# Patient Record
Sex: Female | Born: 2000
Health system: Southern US, Community
[De-identification: ages and names within clinical notes are randomized; demographics above are authoritative.]

## PROBLEM LIST (undated history)

## (undated) HISTORY — PX: HERNIA REPAIR: SHX51

---
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
## 2022-01-02 NOTE — Progress Notes (Unsigned)
[facility] [phone]   [phone]  CONSULT NOTE  REFERRING PHYSICIAN: Dr. 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  And***is referred to the clinic for evaluation of anemia and elevated factor VIII  The patient has recently been evaluated by her OB/GYN for the chief complaint of abnormal uterine bleeding.  While evaluating her abnormal uterine bleeding, she had a CBC and Feng Bilik brand testing performed.  Her CBC showed microcytic anemia with a hemoglobin of 7.7 and a low MCV at 62.5, normal white blood cell count, normal platelet count.  The patient had a normal von Willebrand factor which was 114, normal von Willebrand activity at 77 but her factor VIII was elevated at 165.  Unclear if she is increased risk for thromboembolic events and part of her management for her abnormal uterine bleeding may include estrogen-containing birth control pills or lysteda.  She was referred to the clinic for management of her iron deficiency and consideration of IV iron as well as whether the factor VIII has any clinical significance  I do not have any prior records. To her knowledge, she started having anemia since ***l.  She denies ever needing a blood transfusion or an iron infusion before.    She denies heavy menstrual bleeding and reports her menstrual cycles last approximately *** days, heavy ***.  On the second day of her cycle, she estimates that she will need *** tampons.     Regarding symptoms, she reports fatigue***.   She denies any lightheadedness or dyspnea on exertion.  She reports she had palpitations ***s.  She denies epistaxis, gingival bleeding, hemoptysis, hematemesis, or melena.  Denies any changes in bowel habits.  Denies any fever, chills, or night sweats.   She denies any lymphadenopathy. Denies any NSAID use except she may take *** tablets during her menstrual cycle.  Denies any chronic kidney disease. She denies any particular dietary habits such as being a vegan or vegetarian, but she does not eat a lot of red meat***8.  She estimates she eats red meat approximately ***week.  Denies any history of any bariatric surgery.  She reports craving ice chips***.  Blood clots? Fhx clots.???    HPI  No past medical history on file.  Past Surgical History:  Procedure Laterality Date   HERNIA REPAIR      Family History  Problem Relation Age of Onset   Healthy Mother    Healthy Father     Social History Social History   Tobacco Use   Smoking status: Never   Smokeless tobacco: Never  Vaping Use   Vaping Use: Never used  Substance Use Topics   Alcohol use: Never   Drug use: Never    No Known Allergies  Current Outpatient Medications  Medication Sig Dispense Refill   FLUoxetine (PROZAC) 10 MG capsule TAKE 1 CAPSULE BY MOUTH ONCE DAILY. 30 capsule 1   FLUoxetine (PROZAC) 10 MG capsule Take 1 capsule by mouth once daily. 30 capsule 1   FLUoxetine (PROZAC) 10 MG capsule Take 1 capsule (10 mg total) by mouth daily. 30 capsule 2   FLUoxetine (PROZAC) 20 MG capsule TAKE 1 CAPSULE BY MOUTH DAILY 30 capsule 1   FLUoxetine (PROZAC) 20 MG capsule TAKE 1 CAPSULE BY MOUTH DAILY MAY FILL ON 10/07/20 30  capsule 2   FLUoxetine (PROZAC) 20 MG capsule Take 1 capsule by mouth daily 30 capsule 2   ibuprofen (ADVIL,MOTRIN) 100 MG/5ML suspension Take 20 mLs (400 mg total) by mouth every 6 (six) hours as needed for mild pain. 237 mL 0   norgestimate-ethinyl estradiol (SPRINTEC 28) 0.25-35 MG-MCG tablet Take 1 tablet by mouth daily. 28 tablet 12   ondansetron (ZOFRAN ODT) 4 MG disintegrating tablet Dissolve 1 tablet (4 mg total) by mouth every 8 (eight) hours as needed for nausea or vomiting. 20 tablet 0   oseltamivir (TAMIFLU) 75 MG capsule Take 1 capsule  (75 mg total) by mouth 2 (two) times daily for 5 days 10 capsule 0   pimecrolimus (ELIDEL) 1 % cream Apply to affected areas 2 times daily 60 g 12   propranolol (INDERAL) 20 MG tablet TAKE 1/2 TO 1 TABLET DAILY BY MOUTH FOR SOCIAL ANXIETY TO BE FILLED 10/07/20 30 tablet 2   propranolol (INDERAL) 20 MG tablet TAKE 1/2 TO 1 TABLET BY MOUTH DAILY FOR SOCIAL ANXIETY 30 tablet 1   propranolol (INDERAL) 20 MG tablet TAKE 1/2 TO 1 TABLET BY MOUTH DAILY FOR SOCIAL ANXIETY. 30 tablet 1   propranolol (INDERAL) 20 MG tablet Take 0.5-1 tablet (10-20 mg total) by mouth daily for social anxiety 30 tablet 2   No current facility-administered medications for this visit.    REVIEW OF SYSTEMS:   Review of Systems  Constitutional: Negative for appetite change, chills, fatigue, fever and unexpected weight change.  HENT:   Negative for mouth sores, nosebleeds, sore throat and trouble swallowing.   Eyes: Negative for eye problems and icterus.  Respiratory: Negative for cough, hemoptysis, shortness of breath and wheezing.   Cardiovascular: Negative for chest pain and leg swelling.  Gastrointestinal: Negative for abdominal pain, constipation, diarrhea, nausea and vomiting.  Genitourinary: Negative for bladder incontinence, difficulty urinating, dysuria, frequency and hematuria.   Musculoskeletal: Negative for back pain, gait problem, neck pain and neck stiffness.  Skin: Negative for itching and rash.  Neurological: Negative for dizziness, extremity weakness, gait problem, headaches, light-headedness and seizures.  Hematological: Negative for adenopathy. Does not bruise/bleed easily.  Psychiatric/Behavioral: Negative for confusion, depression and sleep disturbance. The patient is not nervous/anxious.     PHYSICAL EXAMINATION:  There were no vitals taken for this visit.  ECOG PERFORMANCE STATUS: {CHL ONC ECOG [id number]  Physical Exam  Constitutional: Oriented to person, place, and time and well-developed,  well-nourished, and in no distress. No distress.  HENT:  Head: Normocephalic and atraumatic.  Mouth/Throat: Oropharynx is clear and moist. No oropharyngeal exudate.  Eyes: Conjunctivae are normal. Right eye exhibits no discharge. Left eye exhibits no discharge. No scleral icterus.  Neck: Normal range of motion. Neck supple.  Cardiovascular: Normal rate, regular rhythm, normal heart sounds and intact distal pulses.   Pulmonary/Chest: Effort normal and breath sounds normal. No respiratory distress. No wheezes. No rales.  Abdominal: Soft. Bowel sounds are normal. Exhibits no distension and no mass. There is no tenderness.  Musculoskeletal: Normal range of motion. Exhibits no edema.  Lymphadenopathy:    No cervical adenopathy.  Neurological: Alert and oriented to person, place, and time. Exhibits normal muscle tone. Gait normal. Coordination normal.  Skin: Skin is warm and dry. No rash noted. Not diaphoretic. No erythema. No pallor.  Psychiatric: Mood, memory and judgment normal.  Vitals reviewed.  LABORATORY DATA: No results found for: "WBC", "HGB", "HCT", "MCV", "PLT"    Chemistry   No results found for: "NA", "K", "  CL", "CO2", "BUN", "CREATININE", "GLU" No results found for: "CALCIUM", "ALKPHOS", "AST", "ALT", "BILITOT"     RADIOGRAPHIC STUDIES: No results found.  ASSESSMENT: This is a very pleasant 21 year old African-American female referred to the clinic for iron deficiency anemia and elevated factorVIII activity.  The patient had several lab studies performed today including CBC, CMP, iron studies, ferritin, folate, vitamin B12, and hemoglobinopathy panel.  She also had repeat testing for ***VWB? Hypercoag?  The patient was seen with Dr. Arbutus Ped today.  Dr. Arbutus Ped reviewed results recommendations with the patient.  We will arrange for IV iron infusions with Venofer 300 mg weekly x3.  The patient was encouraged to continue taking the iron supplement.  She was also given a  handout of iron rich food strongly encouraged to increase her dietary intake of iron rich food.  We will see her back for follow-up visit in 3 months for evaluation and repeat blood work for repeat CBC, iron studies, and ferritin.  GYN?    The patient voices understanding of current disease status and treatment options and is in agreement with the current care plan.  All questions were answered. The patient knows to call the clinic with any problems, questions or concerns. We can certainly see the patient much sooner if necessary.  Thank you so much for allowing me to participate in the care of Kendra Cline. I will continue to follow up the patient with you and assist in her care.  I spent {CHL ONC TIME VISIT - HYIFO:[id number]} counseling the patient face to face. The total time spent in the appointment was {CHL ONC TIME VISIT - [facility]:[id number]}.  Disclaimer: This note was dictated with voice recognition software. Similar sounding words can inadvertently be transcribed and may not be corrected upon review.   Chinwe Lope L Tamaka Sawin January 05, 2022, 7:05 PM

## 2022-01-06 ENCOUNTER — Other Ambulatory Visit: Payer: Self-pay | Admitting: Physician Assistant

## 2022-01-06 ENCOUNTER — Other Ambulatory Visit: Payer: Self-pay

## 2022-01-06 ENCOUNTER — Inpatient Hospital Stay: Payer: No Typology Code available for payment source | Attending: Internal Medicine | Admitting: Physician Assistant

## 2022-01-06 ENCOUNTER — Encounter: Payer: Self-pay | Admitting: Physician Assistant

## 2022-01-06 ENCOUNTER — Inpatient Hospital Stay: Payer: No Typology Code available for payment source

## 2022-01-06 VITALS — BP 108/68 | HR 68 | Temp 98.6°F | Resp 15 | Ht 67.0 in | Wt 146.7 lb

## 2022-01-06 DIAGNOSIS — D573 Sickle-cell trait: Secondary | ICD-10-CM | POA: Insufficient documentation

## 2022-01-06 DIAGNOSIS — N921 Excessive and frequent menstruation with irregular cycle: Secondary | ICD-10-CM | POA: Diagnosis not present

## 2022-01-06 DIAGNOSIS — D509 Iron deficiency anemia, unspecified: Secondary | ICD-10-CM

## 2022-01-06 DIAGNOSIS — D688 Other specified coagulation defects: Secondary | ICD-10-CM | POA: Diagnosis not present

## 2022-01-06 DIAGNOSIS — R899 Unspecified abnormal finding in specimens from other organs, systems and tissues: Secondary | ICD-10-CM

## 2022-01-06 DIAGNOSIS — D66 Hereditary factor VIII deficiency: Secondary | ICD-10-CM | POA: Diagnosis not present

## 2022-01-06 DIAGNOSIS — D539 Nutritional anemia, unspecified: Secondary | ICD-10-CM | POA: Insufficient documentation

## 2022-01-06 DIAGNOSIS — F32A Depression, unspecified: Secondary | ICD-10-CM

## 2022-01-06 DIAGNOSIS — F419 Anxiety disorder, unspecified: Secondary | ICD-10-CM

## 2022-01-06 LAB — CBC WITH DIFFERENTIAL (CANCER CENTER ONLY)
Abs Immature Granulocytes: 0.01 10*3/uL (ref 0.00–0.07)
Basophils Absolute: 0.1 10*3/uL (ref 0.0–0.1)
Basophils Relative: 1 %
Eosinophils Absolute: 0.1 10*3/uL (ref 0.0–0.5)
Eosinophils Relative: 2 %
HCT: 26.3 % — ABNORMAL LOW (ref 36.0–46.0)
Hemoglobin: 7.9 g/dL — ABNORMAL LOW (ref 12.0–15.0)
Immature Granulocytes: 0 %
Lymphocytes Relative: 41 %
Lymphs Abs: 1.7 10*3/uL (ref 0.7–4.0)
MCH: 20.6 pg — ABNORMAL LOW (ref 26.0–34.0)
MCHC: 30 g/dL (ref 30.0–36.0)
MCV: 68.5 fL — ABNORMAL LOW (ref 80.0–100.0)
Monocytes Absolute: 0.2 10*3/uL (ref 0.1–1.0)
Monocytes Relative: 5 %
Neutro Abs: 2.1 10*3/uL (ref 1.7–7.7)
Neutrophils Relative %: 51 %
Platelet Count: 344 10*3/uL (ref 150–400)
RBC: 3.84 MIL/uL — ABNORMAL LOW (ref 3.87–5.11)
RDW: 18.7 % — ABNORMAL HIGH (ref 11.5–15.5)
WBC Count: 4.2 10*3/uL (ref 4.0–10.5)
nRBC: 0 % (ref 0.0–0.2)

## 2022-01-06 LAB — CMP (CANCER CENTER ONLY)
ALT: 13 U/L (ref 0–44)
AST: 17 U/L (ref 15–41)
Albumin: 4.2 g/dL (ref 3.5–5.0)
Alkaline Phosphatase: 53 U/L (ref 38–126)
Anion gap: 6 (ref 5–15)
BUN: 10 mg/dL (ref 6–20)
CO2: 24 mmol/L (ref 22–32)
Calcium: 9.6 mg/dL (ref 8.9–10.3)
Chloride: 106 mmol/L (ref 98–111)
Creatinine: 0.84 mg/dL (ref 0.44–1.00)
GFR, Estimated: >60 mL/min (ref >60)
Glucose, Bld: 92 mg/dL (ref 70–99)
Potassium: 4 mmol/L (ref 3.5–5.1)
Sodium: 136 mmol/L (ref 135–145)
Total Bilirubin: 0.3 mg/dL (ref 0.3–1.2)
Total Protein: 7.2 g/dL (ref 6.5–8.1)

## 2022-01-06 LAB — IRON AND IRON BINDING CAPACITY (CC-WL,HP ONLY)
Iron: 18 ug/dL — ABNORMAL LOW (ref 28–170)
Saturation Ratios: 4 % — ABNORMAL LOW (ref 10.4–31.8)
TIBC: 486 ug/dL — ABNORMAL HIGH (ref 250–450)
UIBC: 468 ug/dL — ABNORMAL HIGH (ref 148–442)

## 2022-01-06 LAB — VITAMIN B12: Vitamin B-12: 487 pg/mL (ref 180–914)

## 2022-01-06 LAB — FERRITIN: Ferritin: 4 ng/mL — ABNORMAL LOW (ref 11–307)

## 2022-01-06 LAB — SAMPLE TO BLOOD BANK

## 2022-01-06 LAB — FOLATE: Folate: 19.5 ng/mL (ref >5.9)

## 2022-01-07 LAB — FACTOR 8 ASSAY: Coagulation Factor VIII: 157 % — ABNORMAL HIGH (ref 56–140)

## 2022-01-08 ENCOUNTER — Telehealth: Payer: Self-pay | Admitting: Pharmacy Technician

## 2022-01-08 NOTE — Telephone Encounter (Signed)
Auth Submission: no auth needed Payer: [employer] Medication & CPT/J Code(s) submitted: Venofer (Iron Sucrose) J1756 Route of submission (phone, fax, portal): portal Auth type: Buy/Bill Units/visits requested: x3 doses Reference number:  Approval from: 01/08/22 to 04/10/22

## 2022-01-09 ENCOUNTER — Other Ambulatory Visit: Payer: Self-pay | Admitting: Physician Assistant

## 2022-01-09 DIAGNOSIS — R899 Unspecified abnormal finding in specimens from other organs, systems and tissues: Secondary | ICD-10-CM

## 2022-01-09 NOTE — Progress Notes (Signed)
Called and spoke with the OBGYN office of Dr. Steva Ready about the patient's clotting factor result. I informed them that we would recheck the lab here at the cancer center in about 2-3 months. I also informed them that Cassandra recommends the patient avoiding any birth control with estrogen or lysteda. No further action needed at this time.

## 2022-01-16 ENCOUNTER — Ambulatory Visit (INDEPENDENT_AMBULATORY_CARE_PROVIDER_SITE_OTHER): Payer: No Typology Code available for payment source

## 2022-01-16 VITALS — BP 102/66 | HR 75 | Temp 98.8°F | Resp 16 | Ht 67.0 in | Wt 150.6 lb

## 2022-01-16 DIAGNOSIS — D539 Nutritional anemia, unspecified: Secondary | ICD-10-CM | POA: Diagnosis not present

## 2022-01-16 MED ORDER — ACETAMINOPHEN 325 MG PO TABS
650.0000 mg | ORAL_TABLET | Freq: Once | ORAL | Status: AC
  Administered 2022-01-16: 650 mg via ORAL
  Filled 2022-01-16: qty 2

## 2022-01-16 MED ORDER — DIPHENHYDRAMINE HCL 25 MG PO CAPS
25.0000 mg | ORAL_CAPSULE | Freq: Once | ORAL | Status: AC
  Administered 2022-01-16: 25 mg via ORAL
  Filled 2022-01-16: qty 1

## 2022-01-16 MED ORDER — SODIUM CHLORIDE 0.9 % IV SOLN
300.0000 mg | Freq: Once | INTRAVENOUS | Status: AC
  Administered 2022-01-16: 300 mg via INTRAVENOUS
  Filled 2022-01-16: qty 15

## 2022-01-16 NOTE — Progress Notes (Signed)
Diagnosis: Iron Deficiency Anemia  Provider:  Chilton Greathouse, MD  Procedure: Infusion  IV Type: Peripheral, IV Location: L Antecubital  Venofer (Iron Sucrose), Dose: 300 mg  Infusion Start Time: 1401  Infusion Stop Time: 1548  Post Infusion IV Care: Observation period completed and Peripheral IV Discontinued  Discharge: Condition: Good, Destination: Home . AVS provided to patient.   Performed by:  Loney Hering, LPN

## 2022-01-17 ENCOUNTER — Telehealth: Payer: Self-pay | Admitting: Internal Medicine

## 2022-01-17 NOTE — Telephone Encounter (Signed)
Called patient regarding upcoming August appointments, patient is notified. 

## 2022-01-18 DIAGNOSIS — Z419 Encounter for procedure for purposes other than remedying health state, unspecified: Secondary | ICD-10-CM | POA: Diagnosis not present

## 2022-01-22 ENCOUNTER — Ambulatory Visit (INDEPENDENT_AMBULATORY_CARE_PROVIDER_SITE_OTHER): Payer: No Typology Code available for payment source

## 2022-01-22 VITALS — BP 98/63 | HR 89 | Temp 99.0°F | Resp 18 | Ht 68.0 in | Wt 150.8 lb

## 2022-01-22 DIAGNOSIS — D539 Nutritional anemia, unspecified: Secondary | ICD-10-CM | POA: Diagnosis not present

## 2022-01-22 MED ORDER — ACETAMINOPHEN 325 MG PO TABS: 650.0000 mg | ORAL_TABLET | Freq: Once | ORAL | Status: DC

## 2022-01-22 MED ORDER — SODIUM CHLORIDE 0.9 % IV SOLN
300.0000 mg | Freq: Once | INTRAVENOUS | Status: AC
  Administered 2022-01-22: 300 mg via INTRAVENOUS
  Filled 2022-01-22: qty 15

## 2022-01-22 MED ORDER — DIPHENHYDRAMINE HCL 25 MG PO CAPS: 25.0000 mg | ORAL_CAPSULE | Freq: Once | ORAL | Status: DC

## 2022-01-22 NOTE — Progress Notes (Signed)
Diagnosis: Iron Deficiency Anemia  Provider:  Chilton Greathouse, MD  Procedure: Infusion  IV Type: Peripheral, IV Location: R Forearm  Venofer (Iron Sucrose), Dose: 300 mg  Infusion Start Time: 1350  Infusion Stop Time: 1523  Post Infusion IV Care: Peripheral IV Discontinued  Discharge: Condition: Good, Destination: Home . AVS provided to patient.   Performed by:  Loney Hering, LPN

## 2022-01-31 ENCOUNTER — Ambulatory Visit (INDEPENDENT_AMBULATORY_CARE_PROVIDER_SITE_OTHER): Payer: No Typology Code available for payment source

## 2022-01-31 VITALS — BP 113/73 | HR 75 | Temp 97.9°F | Resp 16 | Ht 67.0 in | Wt 148.0 lb

## 2022-01-31 DIAGNOSIS — D539 Nutritional anemia, unspecified: Secondary | ICD-10-CM

## 2022-01-31 MED ORDER — SODIUM CHLORIDE 0.9 % IV SOLN
300.0000 mg | Freq: Once | INTRAVENOUS | Status: AC
  Administered 2022-01-31: 300 mg via INTRAVENOUS
  Filled 2022-01-31: qty 15

## 2022-01-31 MED ORDER — DIPHENHYDRAMINE HCL 25 MG PO CAPS
25.0000 mg | ORAL_CAPSULE | Freq: Once | ORAL | Status: DC
  Filled 2022-01-31: qty 1

## 2022-01-31 MED ORDER — ACETAMINOPHEN 325 MG PO TABS
650.0000 mg | ORAL_TABLET | Freq: Once | ORAL | Status: DC
  Filled 2022-01-31: qty 2

## 2022-01-31 NOTE — Progress Notes (Signed)
Diagnosis: Iron Deficiency Anemia  Provider:  Chilton Greathouse, MD  Procedure: Infusion  IV Type: Peripheral, IV Location: L Antecubital  Venofer (Iron Sucrose), Dose: 300 mg  Infusion Start Time: 1353  Infusion Stop Time: 1528  Post Infusion IV Care: Peripheral IV Discontinued  Discharge: Condition: Good, Destination: Home . AVS provided to patient.   Performed by:  Loney Hering, LPN

## 2022-02-07 ENCOUNTER — Other Ambulatory Visit (HOSPITAL_COMMUNITY): Payer: Self-pay

## 2022-02-10 ENCOUNTER — Telehealth: Payer: Self-pay | Admitting: Internal Medicine

## 2022-02-10 NOTE — Telephone Encounter (Signed)
Called patient regarding upcoming August appointments, spoke with patient's mother. Patient will be notified.

## 2022-02-11 ENCOUNTER — Other Ambulatory Visit (HOSPITAL_COMMUNITY): Payer: Self-pay

## 2022-02-11 MED ORDER — ADAPALENE 0.1 % EX CREA
TOPICAL_CREAM | CUTANEOUS | 2 refills | Status: DC
  Filled 2022-02-13: qty 45, 30d supply, fill #0

## 2022-02-12 ENCOUNTER — Other Ambulatory Visit (HOSPITAL_COMMUNITY): Payer: Self-pay

## 2022-02-13 ENCOUNTER — Other Ambulatory Visit (HOSPITAL_COMMUNITY): Payer: Self-pay

## 2022-02-14 ENCOUNTER — Other Ambulatory Visit (HOSPITAL_COMMUNITY): Payer: Self-pay

## 2022-02-14 MED ORDER — ADAPALENE 0.1 % EX CREA
TOPICAL_CREAM | CUTANEOUS | 2 refills | Status: DC
  Filled 2022-02-14 – 2022-02-17 (×2): qty 45, 30d supply, fill #0

## 2022-02-17 ENCOUNTER — Other Ambulatory Visit (HOSPITAL_COMMUNITY): Payer: Self-pay

## 2022-02-17 ENCOUNTER — Encounter: Payer: Self-pay | Admitting: Physician Assistant

## 2022-02-18 ENCOUNTER — Other Ambulatory Visit (HOSPITAL_COMMUNITY): Payer: Self-pay

## 2022-02-18 DIAGNOSIS — Z419 Encounter for procedure for purposes other than remedying health state, unspecified: Secondary | ICD-10-CM | POA: Diagnosis not present

## 2022-02-19 ENCOUNTER — Other Ambulatory Visit (HOSPITAL_COMMUNITY): Payer: Self-pay

## 2022-02-24 DIAGNOSIS — L700 Acne vulgaris: Secondary | ICD-10-CM | POA: Diagnosis not present

## 2022-02-26 ENCOUNTER — Other Ambulatory Visit (HOSPITAL_COMMUNITY): Payer: Self-pay

## 2022-02-27 ENCOUNTER — Other Ambulatory Visit (HOSPITAL_COMMUNITY): Payer: Self-pay

## 2022-02-27 ENCOUNTER — Encounter: Payer: Self-pay | Admitting: Physician Assistant

## 2022-02-27 MED ORDER — PIMECROLIMUS 1 % EX CREA
1.0000 | TOPICAL_CREAM | Freq: Two times a day (BID) | CUTANEOUS | 2 refills | Status: AC
  Filled 2022-02-27 – 2022-04-02 (×9): qty 60, 30d supply, fill #0

## 2022-02-27 MED ORDER — CLINDAMYCIN PHOS-BENZOYL PEROX 1.2-5 % EX GEL
1.0000 "application " | Freq: Every day | CUTANEOUS | 2 refills | Status: AC
  Filled 2022-02-27: qty 45, 30d supply, fill #0

## 2022-02-28 ENCOUNTER — Encounter: Payer: Self-pay | Admitting: Physician Assistant

## 2022-02-28 ENCOUNTER — Other Ambulatory Visit (HOSPITAL_COMMUNITY): Payer: Self-pay

## 2022-03-06 ENCOUNTER — Ambulatory Visit: Payer: No Typology Code available for payment source | Admitting: Internal Medicine

## 2022-03-06 ENCOUNTER — Other Ambulatory Visit: Payer: No Typology Code available for payment source

## 2022-03-11 ENCOUNTER — Other Ambulatory Visit: Payer: No Typology Code available for payment source

## 2022-03-11 ENCOUNTER — Ambulatory Visit: Payer: No Typology Code available for payment source | Admitting: Internal Medicine

## 2022-03-17 ENCOUNTER — Inpatient Hospital Stay: Payer: No Typology Code available for payment source | Attending: Internal Medicine

## 2022-03-17 ENCOUNTER — Inpatient Hospital Stay: Payer: No Typology Code available for payment source | Admitting: Internal Medicine

## 2022-03-21 DIAGNOSIS — Z419 Encounter for procedure for purposes other than remedying health state, unspecified: Secondary | ICD-10-CM | POA: Diagnosis not present

## 2022-04-02 ENCOUNTER — Other Ambulatory Visit (HOSPITAL_COMMUNITY): Payer: Self-pay

## 2022-04-03 ENCOUNTER — Other Ambulatory Visit (HOSPITAL_COMMUNITY): Payer: Self-pay

## 2022-04-04 ENCOUNTER — Other Ambulatory Visit (HOSPITAL_COMMUNITY): Payer: Self-pay

## 2022-04-11 ENCOUNTER — Other Ambulatory Visit (HOSPITAL_COMMUNITY): Payer: Self-pay

## 2022-04-20 DIAGNOSIS — Z419 Encounter for procedure for purposes other than remedying health state, unspecified: Secondary | ICD-10-CM | POA: Diagnosis not present

## 2022-05-21 DIAGNOSIS — Z419 Encounter for procedure for purposes other than remedying health state, unspecified: Secondary | ICD-10-CM | POA: Diagnosis not present

## 2022-05-30 DIAGNOSIS — L700 Acne vulgaris: Secondary | ICD-10-CM | POA: Diagnosis not present

## 2022-06-11 DIAGNOSIS — Z113 Encounter for screening for infections with a predominantly sexual mode of transmission: Secondary | ICD-10-CM | POA: Diagnosis not present

## 2022-06-20 DIAGNOSIS — Z419 Encounter for procedure for purposes other than remedying health state, unspecified: Secondary | ICD-10-CM | POA: Diagnosis not present

## 2022-06-24 DIAGNOSIS — N760 Acute vaginitis: Secondary | ICD-10-CM | POA: Diagnosis not present

## 2022-06-24 DIAGNOSIS — Z113 Encounter for screening for infections with a predominantly sexual mode of transmission: Secondary | ICD-10-CM | POA: Diagnosis not present

## 2022-07-21 DIAGNOSIS — Z419 Encounter for procedure for purposes other than remedying health state, unspecified: Secondary | ICD-10-CM | POA: Diagnosis not present

## 2022-08-21 DIAGNOSIS — Z419 Encounter for procedure for purposes other than remedying health state, unspecified: Secondary | ICD-10-CM | POA: Diagnosis not present

## 2022-09-19 DIAGNOSIS — Z419 Encounter for procedure for purposes other than remedying health state, unspecified: Secondary | ICD-10-CM | POA: Diagnosis not present

## 2022-10-13 ENCOUNTER — Other Ambulatory Visit (HOSPITAL_COMMUNITY): Payer: Self-pay

## 2022-10-16 ENCOUNTER — Other Ambulatory Visit (HOSPITAL_COMMUNITY): Payer: Self-pay

## 2022-10-18 ENCOUNTER — Encounter (HOSPITAL_COMMUNITY): Payer: Self-pay | Admitting: Emergency Medicine

## 2022-10-18 ENCOUNTER — Ambulatory Visit (HOSPITAL_COMMUNITY)
Admission: EM | Admit: 2022-10-18 | Discharge: 2022-10-18 | Disposition: A | Discharge status: Home / Self Care | Payer: 59 | Attending: Internal Medicine | Admitting: Internal Medicine

## 2022-10-18 DIAGNOSIS — R112 Nausea with vomiting, unspecified: Secondary | ICD-10-CM

## 2022-10-18 MED ORDER — ONDANSETRON 4 MG PO TBDP
4.0000 mg | ORAL_TABLET | Freq: Three times a day (TID) | ORAL | 0 refills | Status: DC | PRN
  Filled 2022-10-18: qty 20, 7d supply, fill #0

## 2022-10-18 MED ORDER — ONDANSETRON HCL 4 MG/2ML IJ SOLN
4.0000 mg | Freq: Once | INTRAMUSCULAR | Status: AC
  Administered 2022-10-18: 4 mg via INTRAMUSCULAR

## 2022-10-18 MED ORDER — ONDANSETRON HCL 4 MG/2ML IJ SOLN
INTRAMUSCULAR | Status: AC
  Filled 2022-10-18: qty 2

## 2022-10-18 NOTE — ED Triage Notes (Signed)
Pt reports out drinking with friends last night. Had n/v since early morning. Tried taking nausea medications without relief.

## 2022-10-18 NOTE — ED Provider Notes (Signed)
Kendra Cline    CSN: [id number] Arrival date & time: 10/18/22  Kendra Cline      History   Chief Complaint Chief Complaint  Patient presents with   Emesis    HPI Kendra Cline is a 22 y.o. female.   Patient presents to urgent care for evaluation of nausea and vomiting that started this morning abruptly after going out last night with friends.  She has had multiple episodes of nonbloody/nonbilious emesis over the last 12 hours. Drank 2 margaritas last night with normal amount of liquor. Denies history of reaction to alcohol intake in the past.  She states she can sometimes "drink 6 or 7 shots of liquor in 1 night".  Mom is a Marine scientist and had some Zofran at home.  Patient had Zofran at 11 AM this morning (6 hours ago) and has continued to have multiple episodes of nausea/vomiting since.  She is currently nauseous and last episode of emesis was while in urgent care waiting for provider.  No diarrhea, abdominal pain, sore throat, recent viral URI symptoms, fever/chills, body aches, back pain, urinary symptoms, or vaginal symptoms.  Denies flank pain.  No recent antibiotic or steroid use.  She is not a smoker and denies drug use.   Emesis   History reviewed. No pertinent past medical history.  Patient Active Problem List   Diagnosis Date Noted   Deficiency anemia 01/06/2022    Past Surgical History:  Procedure Laterality Date   HERNIA REPAIR      OB History   No obstetric history on file.      Home Medications    Prior to Admission medications   Medication Sig Start Date End Date Taking? Authorizing Provider  ondansetron (ZOFRAN-ODT) 4 MG disintegrating tablet Take 1 tablet (4 mg total) by mouth every 8 (eight) hours as needed for nausea or vomiting. 10/18/22  Yes Talbot Grumbling, FNP  adapalene (DIFFERIN) 0.1 % cream Apply to affected areas daily 02/14/22     adapalene (DIFFERIN) 0.1 % cream Apply topically. 02/11/22     Clindamycin-Benzoyl Per, Refr, gel Apply 1  application  topically at bedtime. 02/24/22     FLUoxetine (PROZAC) 10 MG capsule TAKE 1 CAPSULE BY MOUTH ONCE DAILY. 06/12/20 06/12/21  Corena Pilgrim, MD  FLUoxetine (PROZAC) 10 MG capsule Take 1 capsule by mouth once daily. 09/03/21     FLUoxetine (PROZAC) 10 MG capsule Take 1 capsule (10 mg total) by mouth daily. Patient not taking: Reported on 01/06/2022 10/28/21     FLUoxetine (PROZAC) 20 MG capsule TAKE 1 CAPSULE BY MOUTH DAILY 08/08/20 08/08/21  Corena Pilgrim, MD  FLUoxetine (PROZAC) 20 MG capsule TAKE 1 CAPSULE BY MOUTH DAILY MAY FILL ON 10/07/20 10/01/20 10/01/21  Corena Pilgrim, MD  FLUoxetine (PROZAC) 20 MG capsule Take 1 capsule by mouth daily 12/26/20     ibuprofen (ADVIL,MOTRIN) 100 MG/5ML suspension Take 20 mLs (400 mg total) by mouth every 6 (six) hours as needed for mild pain. 10/21/14   Kristen Cardinal, NP  norgestimate-ethinyl estradiol (SPRINTEC 28) 0.25-35 MG-MCG tablet Take 1 tablet by mouth daily. 03/06/21     oseltamivir (TAMIFLU) 75 MG capsule Take 1 capsule (75 mg total) by mouth 2 (two) times daily for 5 days Patient not taking: Reported on 01/06/2022 05/23/21     pimecrolimus (ELIDEL) 1 % cream Apply to affected areas 2 times daily 05/02/20     pimecrolimus (ELIDEL) 1 % cream Apply 1 Application topically 2 (two) times daily to affected area of  face 02/27/22     propranolol (INDERAL) 20 MG tablet TAKE 1/2 TO 1 TABLET DAILY BY MOUTH FOR SOCIAL ANXIETY TO BE FILLED 10/07/20 10/01/20 10/01/21  Corena Pilgrim, MD  propranolol (INDERAL) 20 MG tablet TAKE 1/2 TO 1 TABLET BY MOUTH DAILY FOR SOCIAL ANXIETY 08/08/20 08/08/21  Corena Pilgrim, MD  propranolol (INDERAL) 20 MG tablet TAKE 1/2 TO 1 TABLET BY MOUTH DAILY FOR SOCIAL ANXIETY. 06/12/20 06/12/21  Corena Pilgrim, MD  propranolol (INDERAL) 20 MG tablet Take 0.5-1 tablet (10-20 mg total) by mouth daily for social anxiety 12/26/20       Family History Family History  Problem Relation Age of Onset   Healthy Mother    Healthy Father      Social History Social History   Tobacco Use   Smoking status: Never   Smokeless tobacco: Never  Vaping Use   Vaping Use: Never used  Substance Use Topics   Alcohol use: Yes   Drug use: Never     Allergies   Patient has no known allergies.   Review of Systems Review of Systems  Gastrointestinal:  Positive for vomiting.  Per HPI   Physical Exam Triage Vital Signs ED Triage Vitals  Enc Vitals Group     BP 10/18/22 1652 130/78     Pulse Rate 10/18/22 1652 80     Resp 10/18/22 1652 16     Temp 10/18/22 1652 98.8 F (37.1 C)     Temp Source 10/18/22 1652 Oral     SpO2 10/18/22 1652 98 %     Weight --      Height --      Head Circumference --      Peak Flow --      Pain Score 10/18/22 1649 10     Pain Loc --      Pain Edu? --      Excl. in [place]? --    No data found.  Updated Vital Signs BP 130/78 (BP Location: Right Arm)   Pulse 80   Temp 98.8 F (37.1 C) (Oral)   Resp 16   LMP 10/10/2022   SpO2 98%   Visual Acuity Right Eye Distance:   Left Eye Distance:   Bilateral Distance:    Right Eye Near:   Left Eye Near:    Bilateral Near:     Physical Exam Vitals and nursing note reviewed.  Constitutional:      Appearance: She is not ill-appearing or toxic-appearing.  HENT:     Head: Normocephalic and atraumatic.     Right Ear: Hearing and external ear normal.     Left Ear: Hearing and external ear normal.     Nose: Nose normal.     Mouth/Throat:     Lips: Pink.     Mouth: Mucous membranes are moist. No injury.     Tongue: No lesions. Tongue does not deviate from midline.     Palate: No mass and lesions.     Pharynx: Oropharynx is clear. Uvula midline. No pharyngeal swelling, oropharyngeal exudate, posterior oropharyngeal erythema or uvula swelling.     Tonsils: No tonsillar exudate or tonsillar abscesses.  Eyes:     General: Lids are normal. Vision grossly intact. Gaze aligned appropriately.     Extraocular Movements: Extraocular movements  intact.     Conjunctiva/sclera: Conjunctivae normal.  Cardiovascular:     Rate and Rhythm: Normal rate and regular rhythm.     Heart sounds: Normal heart sounds, S1 normal and S2  normal.  Pulmonary:     Effort: Pulmonary effort is normal. No respiratory distress.     Breath sounds: Normal breath sounds and air entry.  Abdominal:     General: Bowel sounds are normal.     Palpations: Abdomen is soft.     Tenderness: There is no abdominal tenderness. There is no right CVA tenderness, left CVA tenderness or guarding.  Musculoskeletal:     Cervical back: Neck supple.  Lymphadenopathy:     Cervical: No cervical adenopathy.  Skin:    General: Skin is warm and dry.     Capillary Refill: Capillary refill takes less than 2 seconds.     Findings: No rash.  Neurological:     General: No focal deficit present.     Mental Status: She is alert and oriented to person, place, and time. Mental status is at baseline.     Cranial Nerves: No dysarthria or facial asymmetry.  Psychiatric:        Mood and Affect: Mood normal.        Speech: Speech normal.        Behavior: Behavior normal.        Thought Content: Thought content normal.        Judgment: Judgment normal.      UC Treatments / Results  Labs (all labs ordered are listed, but only abnormal results are displayed) Labs Reviewed - No data to display  EKG   Radiology No results found.  Procedures Procedures (including critical care time)  Medications Ordered in UC Medications  ondansetron (ZOFRAN) injection 4 mg (has no administration in time range)    Initial Impression / Assessment and Plan / UC Course  I have reviewed the triage vital signs and the nursing notes.  Pertinent labs & imaging results that were available during my care of the patient were reviewed by me and considered in my medical decision making (see chart for details).   1.  Nausea and vomiting Patient is nontoxic in appearance with hemodynamically stable  vital signs.  Does not appear to be dry/dehydrated.  Last menstrual cycle was October 10, 2022, not sexually active therefore deferred pregnancy testing.  Nausea and vomiting likely due to alcohol intake last night.  Patient given 4 mg Zofran IM in clinic.  Zofran sent to pharmacy to be used every 8 hours as needed for nausea and vomiting.  If nausea and vomiting persist through the night and into tomorrow morning, advised patient to go to the nearest emergency department due to intractable emesis episodes.  No indication for fluid resuscitation due to stable heart rate and blood pressure.  Advised to drink Pedialyte and eat bland foods over the next 12 to 24 hours, then increase diet as tolerated.  Mom and patient agreeable with plan.  Work note given.  Discussed physical exam and available lab work findings in clinic with patient.  Counseled patient regarding appropriate use of medications and potential side effects for all medications recommended or prescribed today. Discussed red flag signs and symptoms of worsening condition,when to call the PCP office, return to urgent care, and when to seek higher level of care in the emergency department. Patient verbalizes understanding and agreement with plan. All questions answered. Patient discharged in stable condition.   Final Clinical Impressions(s) / UC Diagnoses   Final diagnoses:  Nausea and vomiting, unspecified vomiting type     Discharge Instructions      Your evaluation suggests that your symptoms are most likely due  to viral stomach illness (gastroenteritis) which will improve on its own with rest and fluids in the next few days.   I have prescribed an antinausea medication for you to take at home called Zofran. It is the same medication that we gave you in the office.  You may use tylenol 1,000mg  every 6 hours over the counter as needed for abdominal discomfort related to this virus.   Eat a bland diet for the next 12-24 hours (bananas, rice,  white toast, and applesauce) once you are able to tolerate liquids (broth, etc). These foods are easy for your stomach to digest. Pedialyte can be purchased to help with rehydration. Drink plenty of water.   Please follow up with your primary care provider for further management. Return if you experience worsening or uncontrolled pain, inability to tolerate fluids by mouth, difficulty breathing, fevers 100.98F or greater, recurrent vomiting, or any other concerning symptoms. I hope you feel better!    ED Prescriptions     Medication Sig Dispense Auth. Provider   ondansetron (ZOFRAN-ODT) 4 MG disintegrating tablet Take 1 tablet (4 mg total) by mouth every 8 (eight) hours as needed for nausea or vomiting. 20 tablet Talbot Grumbling, FNP      PDMP not reviewed this encounter.   Talbot Grumbling,  10/18/22 1743

## 2022-10-18 NOTE — Discharge Instructions (Signed)

## 2022-10-19 ENCOUNTER — Emergency Department (HOSPITAL_COMMUNITY): Payer: 59

## 2022-10-19 ENCOUNTER — Other Ambulatory Visit: Payer: Self-pay

## 2022-10-19 ENCOUNTER — Emergency Department (HOSPITAL_COMMUNITY)
Admission: EM | Admit: 2022-10-19 | Discharge: 2022-10-20 | Disposition: A | Discharge status: Home / Self Care | Payer: 59 | Attending: Emergency Medicine | Admitting: Emergency Medicine

## 2022-10-19 DIAGNOSIS — R112 Nausea with vomiting, unspecified: Secondary | ICD-10-CM

## 2022-10-19 DIAGNOSIS — R1110 Vomiting, unspecified: Secondary | ICD-10-CM | POA: Diagnosis not present

## 2022-10-19 DIAGNOSIS — R109 Unspecified abdominal pain: Secondary | ICD-10-CM | POA: Diagnosis not present

## 2022-10-19 DIAGNOSIS — K2900 Acute gastritis without bleeding: Secondary | ICD-10-CM | POA: Diagnosis not present

## 2022-10-19 DIAGNOSIS — R079 Chest pain, unspecified: Secondary | ICD-10-CM | POA: Diagnosis present

## 2022-10-19 LAB — URINALYSIS, ROUTINE W REFLEX MICROSCOPIC
Bilirubin Urine: NEGATIVE
Glucose, UA: NEGATIVE mg/dL
Ketones, ur: NEGATIVE mg/dL
Leukocytes,Ua: NEGATIVE
Nitrite: NEGATIVE
Protein, ur: NEGATIVE mg/dL
Specific Gravity, Urine: 1.024 (ref 1.005–1.030)
pH: 5 (ref 5.0–8.0)

## 2022-10-19 LAB — I-STAT BETA HCG BLOOD, ED (MC, WL, AP ONLY): I-stat hCG, quantitative: <5 m[IU]/mL (ref <5)

## 2022-10-19 LAB — CBC WITH DIFFERENTIAL/PLATELET
Abs Immature Granulocytes: 0.01 10*3/uL (ref 0.00–0.07)
Basophils Absolute: 0.1 10*3/uL (ref 0.0–0.1)
Basophils Relative: 1 %
Eosinophils Absolute: 0.1 10*3/uL (ref 0.0–0.5)
Eosinophils Relative: 1 %
HCT: 37.7 % (ref 36.0–46.0)
Hemoglobin: 12.6 g/dL (ref 12.0–15.0)
Immature Granulocytes: 0 %
Lymphocytes Relative: 41 %
Lymphs Abs: 3.2 10*3/uL (ref 0.7–4.0)
MCH: 28.6 pg (ref 26.0–34.0)
MCHC: 33.4 g/dL (ref 30.0–36.0)
MCV: 85.5 fL (ref 80.0–100.0)
Monocytes Absolute: 0.6 10*3/uL (ref 0.1–1.0)
Monocytes Relative: 7 %
Neutro Abs: 4 10*3/uL (ref 1.7–7.7)
Neutrophils Relative %: 50 %
Platelets: 362 10*3/uL (ref 150–400)
RBC: 4.41 MIL/uL (ref 3.87–5.11)
RDW: 13.1 % (ref 11.5–15.5)
WBC: 7.8 10*3/uL (ref 4.0–10.5)
nRBC: 0 % (ref 0.0–0.2)

## 2022-10-19 LAB — COMPREHENSIVE METABOLIC PANEL
ALT: 67 U/L — ABNORMAL HIGH (ref 0–44)
AST: 46 U/L — ABNORMAL HIGH (ref 15–41)
Albumin: 4.2 g/dL (ref 3.5–5.0)
Alkaline Phosphatase: 73 U/L (ref 38–126)
Anion gap: 9 (ref 5–15)
BUN: 15 mg/dL (ref 6–20)
CO2: 25 mmol/L (ref 22–32)
Calcium: 9.5 mg/dL (ref 8.9–10.3)
Chloride: 101 mmol/L (ref 98–111)
Creatinine, Ser: 1.1 mg/dL — ABNORMAL HIGH (ref 0.44–1.00)
GFR, Estimated: >60 mL/min (ref >60)
Glucose, Bld: 101 mg/dL — ABNORMAL HIGH (ref 70–99)
Potassium: 3.5 mmol/L (ref 3.5–5.1)
Sodium: 135 mmol/L (ref 135–145)
Total Bilirubin: 0.9 mg/dL (ref 0.3–1.2)
Total Protein: 7.4 g/dL (ref 6.5–8.1)

## 2022-10-19 LAB — LIPASE, BLOOD: Lipase: 28 U/L (ref 11–51)

## 2022-10-19 MED ORDER — OXYCODONE-ACETAMINOPHEN 5-325 MG PO TABS
1.0000 | ORAL_TABLET | Freq: Once | ORAL | Status: AC
  Administered 2022-10-19: 1 via ORAL
  Filled 2022-10-19: qty 1

## 2022-10-19 MED ORDER — SODIUM CHLORIDE 0.9 % IV BOLUS
1000.0000 mL | Freq: Once | INTRAVENOUS | Status: AC
  Administered 2022-10-19: 1000 mL via INTRAVENOUS

## 2022-10-19 MED ORDER — ONDANSETRON 4 MG PO TBDP
4.0000 mg | ORAL_TABLET | Freq: Three times a day (TID) | ORAL | 0 refills | Status: AC | PRN
  Filled 2022-10-19: qty 10, 4d supply, fill #0

## 2022-10-19 MED ORDER — ONDANSETRON HCL 4 MG/2ML IJ SOLN
4.0000 mg | Freq: Once | INTRAMUSCULAR | Status: AC
  Administered 2022-10-19: 4 mg via INTRAVENOUS
  Filled 2022-10-19: qty 2

## 2022-10-19 MED ORDER — SODIUM CHLORIDE 0.9 % IV BOLUS
1000.0000 mL | Freq: Once | INTRAVENOUS | Status: AC
  Administered 2022-10-20: 1000 mL via INTRAVENOUS

## 2022-10-19 MED ORDER — FAMOTIDINE 20 MG PO TABS
20.0000 mg | ORAL_TABLET | Freq: Two times a day (BID) | ORAL | 0 refills | Status: DC
  Filled 2022-10-19: qty 10, 5d supply, fill #0

## 2022-10-19 MED ORDER — METOCLOPRAMIDE HCL 5 MG/ML IJ SOLN
10.0000 mg | Freq: Once | INTRAMUSCULAR | Status: AC
  Administered 2022-10-20: 10 mg via INTRAVENOUS
  Filled 2022-10-19: qty 2

## 2022-10-19 MED ORDER — PANTOPRAZOLE SODIUM 40 MG IV SOLR
40.0000 mg | Freq: Once | INTRAVENOUS | Status: AC
  Administered 2022-10-19: 40 mg via INTRAVENOUS
  Filled 2022-10-19: qty 10

## 2022-10-19 MED ORDER — SUCRALFATE 1 G PO TABS
1.0000 g | ORAL_TABLET | Freq: Three times a day (TID) | ORAL | 0 refills | Status: AC
  Filled 2022-10-19: qty 60, 15d supply, fill #0

## 2022-10-19 NOTE — ED Provider Triage Note (Signed)
Emergency Medicine Provider Triage Evaluation Note  Kendra Cline , a 22 y.o. female  was evaluated in triage.  Pt complains of emesis onset yesterday.  Patient was drinking margaritas 2 days ago prior to onset of symptoms.  Evaluated in urgent care and provided with Zofran.  At urgent care was informed that this is likely a viral etiology. Denies abdominal pain, urinary symptoms. Denies marijuana use.  Review of Systems  Positive:  Negative:   Physical Exam  BP (!) 137/99 (BP Location: Right Arm)   Pulse 84   Temp 98.2 F (36.8 C)   Resp 18   LMP 10/10/2022   SpO2 100%  Gen:   Awake, no distress   Resp:  Normal effort  MSK:   Moves extremities without difficulty  Other:  No abdominal TTP.   Medical Decision Making  Medically screening exam initiated at 8:06 PM.  Appropriate orders placed.  Kendra Cline was informed that the remainder of the evaluation will be completed by another provider, this initial triage assessment does not replace that evaluation, and the importance of remaining in the ED until their evaluation is complete.  Work-up initiated.    Cayman Kielbasa A, PA-C 10/19/22 2008

## 2022-10-19 NOTE — ED Notes (Signed)
Gave patient a cup of water to drink, tolerating so far.

## 2022-10-19 NOTE — Discharge Instructions (Addendum)
Please read and follow all provided instructions.  Your diagnoses today include:  1. Nausea and vomiting, unspecified vomiting type   2. Acute gastritis without hemorrhage, unspecified gastritis type     TTests performed today include: Blood cell counts and platelets Kidney and liver function tests: She has slightly high liver function tests, possibly related to drinking alcohol, but consider having these rechecked by your doctor Pancreas function test (called lipase) Urine test to look for infection A blood or urine test for pregnancy (women only) X-ray of the chest and abdomen: Did not show any signs of blockage or problems with the lungs Vital signs. See below for your results today.   Medications prescribed:  Zofran (ondansetron) - for nausea and vomiting  Pepcid (famotidine) - antihistamine  You can find this medication over-the-counter.   DO NOT exceed:  20mg  Pepcid every 12 hours  Carafate - for stomach upset and to protect your stomach  Take any prescribed medications only as directed.  Home care instructions:  Follow any educational materials contained in this packet.  Drink clear liquids for the next 24 hours and introduce solid foods slowly after 24 hours using the b.r.a.t. diet (Bananas, Rice, Applesauce, Toast, Yogurt).    Follow-up instructions: Please follow-up with your primary care provider in the next 2 days for further evaluation of your symptoms. If you are not feeling better in 48 hours you may have a condition that is more serious and you need re-evaluation.   Return instructions:  SEEK IMMEDIATE MEDICAL ATTENTION IF: If you have pain that does not go away or becomes severe  A temperature above 101F develops  Repeated vomiting occurs (multiple episodes)  If you have pain that becomes localized to portions of the abdomen. The right side could possibly be appendicitis. In an adult, the left lower portion of the abdomen could be colitis or diverticulitis.   Blood is being passed in stools or vomit (bright red or black tarry stools)  You develop chest pain, difficulty breathing, dizziness or fainting, or become confused, poorly responsive, or inconsolable (young children) If you have any other emergent concerns regarding your health  Additional Information: Abdominal (belly) pain can be caused by many things. Your caregiver performed an examination and possibly ordered blood/urine tests and imaging (CT scan, x-rays, ultrasound). Many cases can be observed and treated at home after initial evaluation in the emergency department. Even though you are being discharged home, abdominal pain can be unpredictable. Therefore, you need a repeated exam if your pain does not resolve, returns, or worsens. Most patients with abdominal pain don't have to be admitted to the hospital or have surgery, but serious problems like appendicitis and gallbladder attacks can start out as nonspecific pain. Many abdominal conditions cannot be diagnosed in one visit, so follow-up evaluations are very important.  Your vital signs today were: BP (!) 137/99 (BP Location: Right Arm)   Pulse 84   Temp 98.2 F (36.8 C)   Resp 18   LMP 10/10/2022 Comment: Negative pregnancy test on 10/19/22  SpO2 100%  If your blood pressure (bp) was elevated above 135/85 this visit, please have this repeated by your doctor within one month. --------------

## 2022-10-19 NOTE — ED Triage Notes (Signed)
Patient reports persistent emesis onset yesterday after drinking Margaritas last Friday , no diarrhea or fever .

## 2022-10-19 NOTE — ED Provider Notes (Cosign Needed Addendum)
[facility] Provider Note   CSN: [id number] Arrival date & time: 10/19/22  1957     History  Chief Complaint  Patient presents with   Emesis    Kendra Cline is a 22 y.o. female.  Patient presents to the department for evaluation of chest pain and vomiting.  Patient states that she was drinking alcohol on Friday night (today Sunday).  She felt unwell yesterday but had increasing vomiting today.  This is caused her to have a pain in the upper abdomen that radiates up into her chest.  No shortness of breath or fevers.  No cough.  She reports history of umbilical hernia repair at age 5.  She has not been having any bowel movements.  She thinks that she is urinating less but denies blood in the urine or painful urination.  She was seen at urgent care earlier and given a shot of Zofran to help with nausea, but symptoms are now returning and she decided to come to the ED.       Home Medications Prior to Admission medications   Medication Sig Start Date End Date Taking? Authorizing Provider  adapalene (DIFFERIN) 0.1 % cream Apply to affected areas daily 02/14/22     adapalene (DIFFERIN) 0.1 % cream Apply topically. 02/11/22     Clindamycin-Benzoyl Per, Refr, gel Apply 1 application  topically at bedtime. 02/24/22     FLUoxetine (PROZAC) 10 MG capsule TAKE 1 CAPSULE BY MOUTH ONCE DAILY. 06/12/20 06/12/21  Corena Pilgrim, MD  FLUoxetine (PROZAC) 10 MG capsule Take 1 capsule by mouth once daily. 09/03/21     FLUoxetine (PROZAC) 10 MG capsule Take 1 capsule (10 mg total) by mouth daily. Patient not taking: Reported on 01/06/2022 10/28/21     FLUoxetine (PROZAC) 20 MG capsule TAKE 1 CAPSULE BY MOUTH DAILY 08/08/20 08/08/21  Corena Pilgrim, MD  FLUoxetine (PROZAC) 20 MG capsule TAKE 1 CAPSULE BY MOUTH DAILY MAY FILL ON 10/07/20 10/01/20 10/01/21  Corena Pilgrim, MD  FLUoxetine (PROZAC) 20 MG capsule Take 1 capsule by mouth daily 12/26/20     ibuprofen  (ADVIL,MOTRIN) 100 MG/5ML suspension Take 20 mLs (400 mg total) by mouth every 6 (six) hours as needed for mild pain. 10/21/14   Kristen Cardinal, NP  norgestimate-ethinyl estradiol (SPRINTEC 28) 0.25-35 MG-MCG tablet Take 1 tablet by mouth daily. 03/06/21     ondansetron (ZOFRAN-ODT) 4 MG disintegrating tablet Take 1 tablet (4 mg total) by mouth every 8 (eight) hours as needed for nausea or vomiting. 10/18/22   Talbot Grumbling, FNP  oseltamivir (TAMIFLU) 75 MG capsule Take 1 capsule (75 mg total) by mouth 2 (two) times daily for 5 days Patient not taking: Reported on 01/06/2022 05/23/21     pimecrolimus (ELIDEL) 1 % cream Apply to affected areas 2 times daily 05/02/20     pimecrolimus (ELIDEL) 1 % cream Apply 1 Application topically 2 (two) times daily to affected area of face 02/27/22     propranolol (INDERAL) 20 MG tablet TAKE 1/2 TO 1 TABLET DAILY BY MOUTH FOR SOCIAL ANXIETY TO BE FILLED 10/07/20 10/01/20 10/01/21  Corena Pilgrim, MD  propranolol (INDERAL) 20 MG tablet TAKE 1/2 TO 1 TABLET BY MOUTH DAILY FOR SOCIAL ANXIETY 08/08/20 08/08/21  Corena Pilgrim, MD  propranolol (INDERAL) 20 MG tablet TAKE 1/2 TO 1 TABLET BY MOUTH DAILY FOR SOCIAL ANXIETY. 06/12/20 06/12/21  Corena Pilgrim, MD  propranolol (INDERAL) 20 MG tablet Take 0.5-1 tablet (10-20 mg total) by  mouth daily for social anxiety 12/26/20         Allergies    Patient has no known allergies.    Review of Systems   Review of Systems  Physical Exam Updated Vital Signs BP (!) 137/99 (BP Location: Right Arm)   Pulse 84   Temp 98.2 F (36.8 C)   Resp 18   LMP 10/10/2022   SpO2 100%  Physical Exam Vitals and nursing note reviewed.  Constitutional:      General: She is not in acute distress.    Appearance: She is well-developed.  HENT:     Head: Normocephalic and atraumatic.     Right Ear: External ear normal.     Left Ear: External ear normal.     Nose: Nose normal.  Eyes:     Conjunctiva/sclera: Conjunctivae normal.   Cardiovascular:     Rate and Rhythm: Normal rate and regular rhythm.     Heart sounds: No murmur heard. Pulmonary:     Effort: No respiratory distress.     Breath sounds: No wheezing, rhonchi or rales.  Abdominal:     Palpations: Abdomen is soft.     Tenderness: There is abdominal tenderness. There is no guarding or rebound.     Comments: Mild epigastric tenderness  Musculoskeletal:     Cervical back: Normal range of motion and neck supple.     Right lower leg: No edema.     Left lower leg: No edema.  Skin:    General: Skin is warm and dry.     Findings: No rash.  Neurological:     General: No focal deficit present.     Mental Status: She is alert. Mental status is at baseline.     Motor: No weakness.  Psychiatric:        Mood and Affect: Mood normal.     ED Results / Procedures / Treatments   Labs (all labs ordered are listed, but only abnormal results are displayed) Labs Reviewed  COMPREHENSIVE METABOLIC PANEL - Abnormal; Notable for the following components:      Result Value   Glucose, Bld 101 (*)    Creatinine, Ser 1.10 (*)    AST 46 (*)    ALT 67 (*)    All other components within normal limits  URINALYSIS, ROUTINE W REFLEX MICROSCOPIC - Abnormal; Notable for the following components:   APPearance HAZY (*)    Hgb urine dipstick SMALL (*)    Bacteria, UA RARE (*)    All other components within normal limits  CBC WITH DIFFERENTIAL/PLATELET  LIPASE, BLOOD  I-STAT BETA HCG BLOOD, ED ([facility], [facility], [facility] ONLY)    EKG None  Radiology DG Abdomen Acute W/Chest  Result Date: 10/19/2022 CLINICAL DATA:  Vomiting, chest and abdominal pain EXAM: DG ABDOMEN ACUTE WITH 1 VIEW CHEST COMPARISON:  None Available. FINDINGS: There is no evidence of dilated bowel loops or free intraperitoneal air. No radiopaque calculi or other significant radiographic abnormality is seen. Heart size and mediastinal contours are within normal limits. Both lungs are clear. IMPRESSION: Negative abdominal  radiographs.  No acute cardiopulmonary disease. Electronically Signed   By: Fidela Salisbury M.D.   On: 10/19/2022 21:45    Procedures Procedures    Medications Ordered in ED Medications  sodium chloride 0.9 % bolus 1,000 mL (1,000 mLs Intravenous New Bag/Given 10/19/22 2109)  ondansetron (ZOFRAN) injection 4 mg (4 mg Intravenous Given 10/19/22 2105)  pantoprazole (PROTONIX) injection 40 mg (40 mg Intravenous Given 10/19/22  2106)  oxyCODONE-acetaminophen (PERCOCET/ROXICET) 5-325 MG per tablet 1 tablet (1 tablet Oral Given 10/19/22 2247)    ED Course/ Medical Decision Making/ A&P    Patient seen and examined. History obtained directly from patient.   Labs/EKG: Ordered CBC, CMP, lipase, pregnancy and UA ordered in triage, pending results..  Imaging: X-ray of the abdomen and chest  Medications/Fluids: Ordered: IV fluids, Zofran, Protonix.   Most recent vital signs reviewed and are as follows: BP (!) 137/99 (BP Location: Right Arm)   Pulse 84   Temp 98.2 F (36.8 C)   Resp 18   LMP 10/10/2022   SpO2 100%   Initial impression: Nausea and vomiting, chest pain likely GI etiology  11:19 PM Reassessment performed. Patient appears stable.  Mother now at bedside.  No vomiting.  She has received IV fluids.  Labs personally reviewed and interpreted including: CBC with differential unremarkable; CMP very mild elevation in transaminases, creatinine 1.10 otherwise unremarkable; lipase normal.  UA without compelling signs of infection.  Pregnancy negative.  Imaging personally visualized and interpreted including: Acute abdominal plain films, agree no acute findings.  Reviewed pertinent lab work and imaging with patient at bedside. Questions answered.   Most current vital signs reviewed and are as follows: BP (!) 137/99 (BP Location: Right Arm)   Pulse 84   Temp 98.2 F (36.8 C)   Resp 18   LMP 10/10/2022 Comment: Negative pregnancy test on 10/19/22  SpO2 100%   Plan: Discharge to home.    Prescriptions written for: Zofran, Carafate, Pepcid  Other home care instructions discussed: Clear liquid diet with slow advancement to bland diet  ED return instructions discussed: The patient was urged to return to the Emergency Department immediately with worsening of current symptoms, worsening abdominal pain, persistent vomiting, blood noted in stools, fever, or any other concerns. The patient verbalized understanding.   Follow-up instructions discussed: Patient encouraged to follow-up with their PCP in 2-3 days if not improving.   11:29 PM Notified by RN that patient has resumed vomiting. I have ordered additional fluid bolus and IV reglan.   Signout to [employer] to re-eval and d/c when able.                             Medical Decision Making Amount and/or Complexity of Data Reviewed Radiology: ordered.  Risk Prescription drug management.   For this patient's complaint of abdominal pain, the following conditions were considered on the differential diagnosis: gastritis/PUD, enteritis/duodenitis, appendicitis, cholelithiasis/cholecystitis, cholangitis, pancreatitis, ruptured viscus, colitis, diverticulitis, small/large bowel obstruction, proctitis, cystitis, pyelonephritis, ureteral colic, aortic dissection, aortic aneurysm. In women, ectopic pregnancy, pelvic inflammatory disease, ovarian cysts, and tubo-ovarian abscess were also considered. Atypical chest etiologies were also considered including ACS, PE, and pneumonia.  The patient's vital signs, pertinent lab work and imaging were reviewed and interpreted as discussed in the ED course. Hospitalization was considered for further testing, treatments, or serial exams/observation. However as patient is well-appearing, has a stable exam, and reassuring studies today, I do not feel that they warrant admission at this time. This plan was discussed with the patient who verbalizes agreement and comfort with this plan and seems  reliable and able to return to the Emergency Department with worsening or changing symptoms.          Final Clinical Impression(s) / ED Diagnoses Final diagnoses:  Nausea and vomiting, unspecified vomiting type  Acute gastritis without hemorrhage, unspecified gastritis type  Rx / DC Orders ED Discharge Orders          Ordered    ondansetron (ZOFRAN-ODT) 4 MG disintegrating tablet  Every 8 hours PRN        10/19/22 2317    famotidine (PEPCID) 20 MG tablet  2 times daily        10/19/22 2317    sucralfate (CARAFATE) 1 g tablet  3 times daily with meals & bedtime        10/19/22 2317              Carlisle Cater, PA-C 10/19/22 2321    Carlisle Cater, PA-C 10/19/22 2329    Margette Fast, MD 10/22/22 1731

## 2022-10-20 ENCOUNTER — Other Ambulatory Visit (HOSPITAL_COMMUNITY): Payer: Self-pay

## 2022-10-20 ENCOUNTER — Encounter (HOSPITAL_COMMUNITY): Payer: Self-pay

## 2022-10-20 DIAGNOSIS — K2900 Acute gastritis without bleeding: Secondary | ICD-10-CM | POA: Diagnosis not present

## 2022-10-20 DIAGNOSIS — Z419 Encounter for procedure for purposes other than remedying health state, unspecified: Secondary | ICD-10-CM | POA: Diagnosis not present

## 2022-10-20 NOTE — ED Provider Notes (Signed)
Received at shift change from Alecia Lemming PA-C please see note for full detail  Patient with without significant medical history presented with complaints of nausea vomiting, states that she had few margaritas Friday night, has been having slight epigastric pain nausea vomiting difficulty holding anything down, still passing gas having normal bowel movements, denies any bloody stools or bloody emesis, has had hernia repair in the past has no other complaints.  Per previous provider follow-up on patient p.o. challenge and likely discharge home Physical Exam  BP (!) 108/94   Pulse 84   Temp 98.2 F (36.8 C)   Resp 18   Ht 5\' 7"  (1.702 m)   Wt 68 kg   LMP 10/10/2022 Comment: Negative pregnancy test on 10/19/22  SpO2 97%   BMI 23.49 kg/m   Physical Exam Vitals and nursing note reviewed.  Constitutional:      General: She is not in acute distress.    Appearance: She is not ill-appearing.  HENT:     Head: Normocephalic and atraumatic.     Nose: No congestion.  Eyes:     Conjunctiva/sclera: Conjunctivae normal.  Cardiovascular:     Rate and Rhythm: Normal rate and regular rhythm.     Pulses: Normal pulses.  Pulmonary:     Effort: Pulmonary effort is normal.  Abdominal:     Palpations: Abdomen is soft.     Tenderness: There is no abdominal tenderness. There is no right CVA tenderness or left CVA tenderness.  Skin:    General: Skin is warm and dry.  Neurological:     Mental Status: She is alert.  Psychiatric:        Mood and Affect: Mood normal.     Procedures  Procedures  ED Course / MDM    Medical Decision Making Amount and/or Complexity of Data Reviewed Radiology: ordered.  Risk Prescription drug management.   Lab Tests:  I Ordered, and personally interpreted labs.  The pertinent results include: CBC unremarkable, CMP reveals glucose 101, creatinine 1.10, AST 46 ALT is 67, UA unremarkable, lipase 28, hCG less than 5   Imaging Studies ordered:  I ordered  imaging studies including plain film abdomen I independently visualized and interpreted imaging which showed negative acute findings I agree with the radiologist interpretation   Cardiac Monitoring:  The patient was maintained on a cardiac monitor.  I personally viewed and interpreted the cardiac monitored which showed an underlying rhythm of: N/A   Medicines ordered and prescription drug management:  I ordered medication including N/A I have reviewed the patients home medicines and have made adjustments as needed  Critical Interventions:  N/A   Reevaluation:  I reassessed the patient she is resting comfortably, has not had any more emesis since last antiemetic, she is tolerating p.o., reassessed the abdomen and soft nontender, her and guardian are both in agreement with discharge at this time.  Consultations Obtained:  N/a    Test Considered:  N/a    Rule out   I have low suspicion for liver or gallbladder abnormality as she has no right upper quadrant tenderness, alk phos, T bili all within normal limits.  She does have slightly elevated liver enzymes by suspect this is more transient likely from alcohol use.  Low suspicion for pancreatitis as lipase is within normal limits.  Low suspicion for ruptured stomach ulcer as she has no peritoneal sign present on exam.  Low suspicion for bowel obstruction as abdomen is nondistended normal bowel sounds, so passing gas  and having normal bowel movements.  Low suspicion for complicated diverticulitis as she is nontoxic-appearing, vital signs reassuring no leukocytosis present.  Low suspicion for appendicitis as she has no right lower quadrant tenderness, vital signs reassuring.     Dispostion and problem list  After consideration of the diagnostic results and the patients response to treatment, I feel that the patent would benefit from discharge.  Gastritis-will recommend bland diet, provide antiemetics, Protonix, follow-up with  PCP for further evaluation and strict return precautions.           Carroll Sage, PA-C 10/20/22 4239    Nira Conn, MD 10/25/22 [phone]

## 2022-10-20 NOTE — ED Notes (Signed)
Patient verbalizes understanding of discharge instructions. Opportunity for questioning and answers were provided. Armband removed by staff, pt discharged from ED. Pt ambulatory to ED waiting room with steady gait.  

## 2022-10-21 ENCOUNTER — Other Ambulatory Visit (HOSPITAL_COMMUNITY): Payer: Self-pay

## 2022-10-21 ENCOUNTER — Other Ambulatory Visit: Payer: Self-pay

## 2022-10-21 DIAGNOSIS — R7989 Other specified abnormal findings of blood chemistry: Secondary | ICD-10-CM | POA: Diagnosis not present

## 2022-10-21 DIAGNOSIS — R1110 Vomiting, unspecified: Secondary | ICD-10-CM | POA: Diagnosis not present

## 2022-10-21 DIAGNOSIS — R1013 Epigastric pain: Secondary | ICD-10-CM | POA: Diagnosis not present

## 2022-10-21 MED ORDER — PROMETHAZINE HCL 25 MG RE SUPP
25.0000 mg | Freq: Four times a day (QID) | RECTAL | 0 refills | Status: DC
  Filled 2022-10-21: qty 30, 8d supply, fill #0
  Filled 2022-10-21: qty 12, 3d supply, fill #0
  Filled 2022-10-21: qty 18, 5d supply, fill #0

## 2022-10-21 MED ORDER — PROMETHAZINE HCL 25 MG RE SUPP
25.0000 mg | Freq: Four times a day (QID) | RECTAL | 0 refills | Status: DC | PRN
  Filled 2022-10-21: qty 30, 8d supply, fill #0
  Filled 2022-10-21: qty 12, 3d supply, fill #0

## 2022-10-22 ENCOUNTER — Emergency Department (HOSPITAL_BASED_OUTPATIENT_CLINIC_OR_DEPARTMENT_OTHER): Payer: 59

## 2022-10-22 ENCOUNTER — Emergency Department (HOSPITAL_BASED_OUTPATIENT_CLINIC_OR_DEPARTMENT_OTHER)
Admission: EM | Admit: 2022-10-22 | Discharge: 2022-10-22 | Disposition: A | Discharge status: Home / Self Care | Payer: 59 | Attending: Emergency Medicine | Admitting: Emergency Medicine

## 2022-10-22 ENCOUNTER — Other Ambulatory Visit: Payer: Self-pay

## 2022-10-22 ENCOUNTER — Encounter (HOSPITAL_BASED_OUTPATIENT_CLINIC_OR_DEPARTMENT_OTHER): Payer: Self-pay

## 2022-10-22 DIAGNOSIS — K3189 Other diseases of stomach and duodenum: Secondary | ICD-10-CM | POA: Diagnosis not present

## 2022-10-22 DIAGNOSIS — R112 Nausea with vomiting, unspecified: Secondary | ICD-10-CM | POA: Insufficient documentation

## 2022-10-22 DIAGNOSIS — R1010 Upper abdominal pain, unspecified: Secondary | ICD-10-CM | POA: Diagnosis not present

## 2022-10-22 DIAGNOSIS — E860 Dehydration: Secondary | ICD-10-CM | POA: Diagnosis not present

## 2022-10-22 LAB — COMPREHENSIVE METABOLIC PANEL
ALT: 35 U/L (ref 0–44)
AST: 18 U/L (ref 15–41)
Albumin: 4.7 g/dL (ref 3.5–5.0)
Alkaline Phosphatase: 68 U/L (ref 38–126)
Anion gap: 10 (ref 5–15)
BUN: 11 mg/dL (ref 6–20)
CO2: 26 mmol/L (ref 22–32)
Calcium: 9.8 mg/dL (ref 8.9–10.3)
Chloride: 99 mmol/L (ref 98–111)
Creatinine, Ser: 0.96 mg/dL (ref 0.44–1.00)
GFR, Estimated: >60 mL/min (ref >60)
Glucose, Bld: 102 mg/dL — ABNORMAL HIGH (ref 70–99)
Potassium: 3.8 mmol/L (ref 3.5–5.1)
Sodium: 135 mmol/L (ref 135–145)
Total Bilirubin: 1.1 mg/dL (ref 0.3–1.2)
Total Protein: 7.5 g/dL (ref 6.5–8.1)

## 2022-10-22 LAB — URINALYSIS, ROUTINE W REFLEX MICROSCOPIC
Bilirubin Urine: NEGATIVE
Glucose, UA: 100 mg/dL — AB
Ketones, ur: >80 mg/dL — AB
Leukocytes,Ua: NEGATIVE
Nitrite: NEGATIVE
Protein, ur: 30 mg/dL — AB
Specific Gravity, Urine: 1.02 (ref 1.005–1.030)
pH: 6 (ref 5.0–8.0)

## 2022-10-22 LAB — CBC
HCT: 36.5 % (ref 36.0–46.0)
Hemoglobin: 12.5 g/dL (ref 12.0–15.0)
MCH: 28.4 pg (ref 26.0–34.0)
MCHC: 34.2 g/dL (ref 30.0–36.0)
MCV: 83 fL (ref 80.0–100.0)
Platelets: 345 10*3/uL (ref 150–400)
RBC: 4.4 MIL/uL (ref 3.87–5.11)
RDW: 12.7 % (ref 11.5–15.5)
WBC: 10.8 10*3/uL — ABNORMAL HIGH (ref 4.0–10.5)
nRBC: 0 % (ref 0.0–0.2)

## 2022-10-22 LAB — LIPASE, BLOOD: Lipase: 53 U/L — ABNORMAL HIGH (ref 11–51)

## 2022-10-22 LAB — PREGNANCY, URINE: Preg Test, Ur: NEGATIVE

## 2022-10-22 MED ORDER — SODIUM CHLORIDE 0.9 % IV BOLUS
1000.0000 mL | Freq: Once | INTRAVENOUS | Status: AC
  Administered 2022-10-22: 1000 mL via INTRAVENOUS

## 2022-10-22 MED ORDER — HALOPERIDOL LACTATE 5 MG/ML IJ SOLN
2.5000 mg | Freq: Once | INTRAMUSCULAR | Status: AC
  Administered 2022-10-22: 2.5 mg via INTRAVENOUS
  Filled 2022-10-22: qty 1

## 2022-10-22 MED ORDER — METOCLOPRAMIDE HCL 10 MG PO TABS
10.0000 mg | ORAL_TABLET | Freq: Four times a day (QID) | ORAL | 0 refills | Status: AC
  Filled 2022-10-22: qty 10, 3d supply, fill #0

## 2022-10-22 MED ORDER — IOHEXOL 300 MG/ML  SOLN
100.0000 mL | Freq: Once | INTRAMUSCULAR | Status: AC | PRN
  Administered 2022-10-22: 75 mL via INTRAVENOUS

## 2022-10-22 NOTE — ED Provider Notes (Signed)
[facility] Provider Note   CSN: [id number] Arrival date & time: 10/22/22  1744     History  Chief Complaint  Patient presents with   Emesis    Kendra Cline is a 22 y.o. female.   Emesis Patient presents with nausea vomit.  Has had since Saturday with today being Wednesday.  Had drank 2 margaritas and ate some food and then began to vomit.  Other people ate the same food and drink the same thing and did not vomit.  Has had ER visit and urgent care visits since the episode.  Continues to feel bad.  Has had Phenergan both through PCP and through the ER.  States continues to vomit.  Also has had Zofran.  No dysuria.  No diarrhea.  No fevers.  Pain in the upper abdomen.  Denies marijuana use.    History reviewed. No pertinent past medical history.  Home Medications Prior to Admission medications   Medication Sig Start Date End Date Taking? Authorizing Provider  metoCLOPramide (REGLAN) 10 MG tablet Take 1 tablet (10 mg total) by mouth every 6 (six) hours. 10/22/22  Yes Davonna Belling, MD  adapalene (DIFFERIN) 0.1 % cream Apply to affected areas daily 02/14/22     adapalene (DIFFERIN) 0.1 % cream Apply topically. 02/11/22     Clindamycin-Benzoyl Per, Refr, gel Apply 1 application  topically at bedtime. 02/24/22     famotidine (PEPCID) 20 MG tablet Take 1 tablet (20 mg total) by mouth 2 (two) times daily. 10/19/22   Carlisle Cater, PA-C  FLUoxetine (PROZAC) 10 MG capsule TAKE 1 CAPSULE BY MOUTH ONCE DAILY. 06/12/20 06/12/21  Corena Pilgrim, MD  FLUoxetine (PROZAC) 10 MG capsule Take 1 capsule by mouth once daily. 09/03/21     FLUoxetine (PROZAC) 10 MG capsule Take 1 capsule (10 mg total) by mouth daily. Patient not taking: Reported on 01/06/2022 10/28/21     FLUoxetine (PROZAC) 20 MG capsule TAKE 1 CAPSULE BY MOUTH DAILY 08/08/20 08/08/21  Corena Pilgrim, MD  FLUoxetine (PROZAC) 20 MG capsule TAKE 1 CAPSULE BY MOUTH DAILY MAY FILL ON 10/07/20  10/01/20 10/01/21  Corena Pilgrim, MD  FLUoxetine (PROZAC) 20 MG capsule Take 1 capsule by mouth daily 12/26/20     ibuprofen (ADVIL,MOTRIN) 100 MG/5ML suspension Take 20 mLs (400 mg total) by mouth every 6 (six) hours as needed for mild pain. 10/21/14   Kristen Cardinal, NP  norgestimate-ethinyl estradiol (SPRINTEC 28) 0.25-35 MG-MCG tablet Take 1 tablet by mouth daily. 03/06/21     ondansetron (ZOFRAN-ODT) 4 MG disintegrating tablet Take 1 tablet (4 mg total) by mouth every 8 (eight) hours as needed for nausea or vomiting. 10/19/22   Carlisle Cater, PA-C  oseltamivir (TAMIFLU) 75 MG capsule Take 1 capsule (75 mg total) by mouth 2 (two) times daily for 5 days Patient not taking: Reported on 01/06/2022 05/23/21     pimecrolimus (ELIDEL) 1 % cream Apply to affected areas 2 times daily 05/02/20     pimecrolimus (ELIDEL) 1 % cream Apply 1 Application topically 2 (two) times daily to affected area of face 02/27/22     promethazine (PHENERGAN) 25 MG suppository Place 1 suppository (25 mg total) rectally every 6 (six) hours as needed. 10/21/22     propranolol (INDERAL) 20 MG tablet TAKE 1/2 TO 1 TABLET DAILY BY MOUTH FOR SOCIAL ANXIETY TO BE FILLED 10/07/20 10/01/20 10/01/21  Corena Pilgrim, MD  propranolol (INDERAL) 20 MG tablet TAKE 1/2 TO 1 TABLET BY MOUTH DAILY  FOR SOCIAL ANXIETY 08/08/20 08/08/21  Corena Pilgrim, MD  propranolol (INDERAL) 20 MG tablet TAKE 1/2 TO 1 TABLET BY MOUTH DAILY FOR SOCIAL ANXIETY. 06/12/20 06/12/21  Corena Pilgrim, MD  propranolol (INDERAL) 20 MG tablet Take 0.5-1 tablet (10-20 mg total) by mouth daily for social anxiety 12/26/20     sucralfate (CARAFATE) 1 g tablet Take 1 tablet (1 g total) by mouth 4 (four) times daily -  with meals and at bedtime. 10/19/22   Carlisle Cater, PA-C      Allergies    Patient has no known allergies.    Review of Systems   Review of Systems  Gastrointestinal:  Positive for vomiting.    Physical Exam Updated Vital Signs BP 135/89   Pulse 78   Temp  98.9 F (37.2 C) (Oral)   Resp 16   Ht 5\' 7"  (1.702 m)   Wt 68 kg   LMP 10/10/2022 Comment: Negative pregnancy test on 10/19/22  SpO2 100%   BMI 23.48 kg/m  Physical Exam Vitals and nursing note reviewed.  HENT:     Head: Atraumatic.  Eyes:     Pupils: Pupils are equal, round, and reactive to light.  Abdominal:     Comments: Mild upper abdominal tenderness without rebound or guarding.  No hernia palpated.  Musculoskeletal:        General: No tenderness.     Cervical back: Neck supple.  Skin:    General: Skin is warm.     Capillary Refill: Capillary refill takes less than 2 seconds.  Neurological:     Mental Status: She is oriented to person, place, and time.     ED Results / Procedures / Treatments   Labs (all labs ordered are listed, but only abnormal results are displayed) Labs Reviewed  LIPASE, BLOOD - Abnormal; Notable for the following components:      Result Value   Lipase 53 (*)    All other components within normal limits  COMPREHENSIVE METABOLIC PANEL - Abnormal; Notable for the following components:   Glucose, Bld 102 (*)    All other components within normal limits  CBC - Abnormal; Notable for the following components:   WBC 10.8 (*)    All other components within normal limits  URINALYSIS, ROUTINE W REFLEX MICROSCOPIC - Abnormal; Notable for the following components:   Glucose, UA 100 (*)    Hgb urine dipstick MODERATE (*)    Ketones, ur >80 (*)    Protein, ur 30 (*)    Bacteria, UA RARE (*)    All other components within normal limits  PREGNANCY, URINE    EKG EKG Interpretation  Date/Time:  Wednesday October 22 2022 20:03:02 EDT Ventricular Rate:  68 PR Interval:  113 QRS Duration: 84 QT Interval:  425 QTC Calculation: 452 R Axis:   84 Text Interpretation: Sinus rhythm Borderline short PR interval Borderline Q waves in lateral leads Confirmed by Davonna Belling [phone]) on 10/22/2022 8:13:58 PM  Radiology CT ABDOMEN PELVIS W CONTRAST  Result  Date: 10/22/2022 CLINICAL DATA:  Nausea, vomiting EXAM: CT ABDOMEN AND PELVIS WITH CONTRAST TECHNIQUE: Multidetector CT imaging of the abdomen and pelvis was performed using the standard protocol following bolus administration of intravenous contrast. RADIATION DOSE REDUCTION: This exam was performed according to the departmental dose-optimization program which includes automated exposure control, adjustment of the mA and/or kV according to patient size and/or use of iterative reconstruction technique. CONTRAST:  51mL OMNIPAQUE IOHEXOL 300 MG/ML  SOLN COMPARISON:  Abdomen radiographs  done on 10/19/2022 FINDINGS: Lower chest: Unremarkable. Hepatobiliary: No focal abnormalities are seen in liver. Gallbladder is not distended. There is no dilation of bile ducts. Pancreas: No focal abnormalities are seen. Spleen: Unremarkable. Adrenals/Urinary Tract: Adrenals are unremarkable. There is no hydronephrosis. There are no renal or ureteral stones. Urinary bladder is not distended. Stomach/Bowel: Stomach is unremarkable. Small bowel loops are not dilated. Appendix is difficult to visualize. In [place] of series 2, there is a small caliber tubular structure with air in the lumen in the pericecal region, most likely normal appendix. There is no pericecal stranding or fluid collection. There is no significant wall thickening in colon. There is no pericolic stranding. Vascular/Lymphatic: Vascular structures appear unremarkable. There is a vein draining the lower pole of left kidney extending to the left common iliac vein. No significant lymphadenopathy is seen. Reproductive: Uterus is retroverted. Small to moderate amount of free fluid is seen in right side of pelvis. There are no thick-walled fluid collections in the pelvis. Other: There is no pneumoperitoneum. Small umbilical hernia containing fat is seen. Musculoskeletal: No acute findings are seen. IMPRESSION: There is no evidence of intestinal obstruction or pneumoperitoneum.  There is no hydronephrosis. Appendix is not dilated. Small amount of free fluid in pelvis may suggest recent rupture of ovarian cyst or follicle. Electronically Signed   By: Elmer Picker M.D.   On: 10/22/2022 20:08    Procedures Procedures    Medications Ordered in ED Medications  sodium chloride 0.9 % bolus 1,000 mL (0 mLs Intravenous Stopped 10/22/22 2103)  iohexol (OMNIPAQUE) 300 MG/ML solution 100 mL (75 mLs Intravenous Contrast Given 10/22/22 1954)  sodium chloride 0.9 % bolus 1,000 mL (0 mLs Intravenous Stopped 10/22/22 2207)  haloperidol lactate (HALDOL) injection 2.5 mg (2.5 mg Intravenous Given 10/22/22 2029)    ED Course/ Medical Decision Making/ A&P                             Medical Decision Making Amount and/or Complexity of Data Reviewed Labs: ordered. Radiology: ordered.  Risk Prescription drug management.   Patient continued nausea and vomiting.  Recently seen for same.  Blood work does show dehydration with greater than 80 ketones in the urine.  Lab work otherwise reassuring.  Previous mild LFT elevations now normalized although lipase now just slightly above normal.  CT scan due to persistent symptoms and overall reassuring.  No obstruction.  No clear cause of the pain.  Will hydrate with IV fluids.  CT scan done and reassuring.  No clear cause of pain.  Feels better and has tolerated orals after fluid.  Did have some dehydration.  Since Phenergan seems not to have been working well will switch to Reglan.  Outpatient follow-up as needed.        Final Clinical Impression(s) / ED Diagnoses Final diagnoses:  Nausea and vomiting, unspecified vomiting type  Dehydration    Rx / DC Orders ED Discharge Orders          Ordered    metoCLOPramide (REGLAN) 10 MG tablet  Every 6 hours        10/22/22 2207              Davonna Belling, MD 10/22/22 2321

## 2022-10-22 NOTE — ED Notes (Signed)
Pt tolerating gingerale and crackers

## 2022-10-22 NOTE — ED Triage Notes (Signed)
Patient here POV from Home.  Endorses N/V since Saturday. Seen in ED a few days ago and discharged. Seen by PCP yesterday and given IM injection of Phenergan which was somewhat helpful but still endorses N/V. No Diarrhea.   Noted some Tongue Swelling and Redness today.   NAD Noted During triage. A&Ox4. GCS 15. Ambulatory.

## 2022-10-22 NOTE — ED Notes (Signed)
Patient transported to CT 

## 2022-10-22 NOTE — ED Notes (Signed)
Reviewed AVS/discharge instruction with patient. Time allotted for and all questions answered. Patient is agreeable for d/c and escorted to ed exit by staff.  

## 2022-10-23 ENCOUNTER — Other Ambulatory Visit (HOSPITAL_COMMUNITY): Payer: Self-pay

## 2022-10-27 ENCOUNTER — Other Ambulatory Visit (HOSPITAL_COMMUNITY): Payer: Self-pay

## 2022-10-27 ENCOUNTER — Ambulatory Visit (HOSPITAL_COMMUNITY)
Admission: RE | Admit: 2022-10-27 | Discharge: 2022-10-27 | Disposition: A | Discharge status: Home / Self Care | Payer: 59 | Source: Ambulatory Visit | Attending: Family Medicine | Admitting: Family Medicine

## 2022-10-27 ENCOUNTER — Other Ambulatory Visit (HOSPITAL_COMMUNITY): Payer: Self-pay | Admitting: Family Medicine

## 2022-10-27 DIAGNOSIS — R112 Nausea with vomiting, unspecified: Secondary | ICD-10-CM

## 2022-10-27 DIAGNOSIS — Q383 Other congenital malformations of tongue: Secondary | ICD-10-CM

## 2022-10-27 DIAGNOSIS — N39498 Other specified urinary incontinence: Secondary | ICD-10-CM

## 2022-10-27 DIAGNOSIS — R110 Nausea: Secondary | ICD-10-CM | POA: Diagnosis not present

## 2022-10-27 MED ORDER — GADOBUTROL 1 MMOL/ML IV SOLN
7.0000 mL | Freq: Once | INTRAVENOUS | Status: AC | PRN
  Administered 2022-10-27: 7 mL via INTRAVENOUS

## 2022-10-30 DIAGNOSIS — R32 Unspecified urinary incontinence: Secondary | ICD-10-CM | POA: Insufficient documentation

## 2022-10-30 DIAGNOSIS — R569 Unspecified convulsions: Secondary | ICD-10-CM | POA: Insufficient documentation

## 2022-10-30 DIAGNOSIS — Q383 Other congenital malformations of tongue: Secondary | ICD-10-CM | POA: Insufficient documentation

## 2022-11-01 ENCOUNTER — Emergency Department (HOSPITAL_BASED_OUTPATIENT_CLINIC_OR_DEPARTMENT_OTHER)
Admission: EM | Admit: 2022-11-01 | Discharge: 2022-11-01 | Disposition: A | Discharge status: Home / Self Care | Payer: 59 | Attending: Emergency Medicine | Admitting: Emergency Medicine

## 2022-11-01 ENCOUNTER — Emergency Department (HOSPITAL_BASED_OUTPATIENT_CLINIC_OR_DEPARTMENT_OTHER): Payer: 59 | Admitting: Radiology

## 2022-11-01 ENCOUNTER — Other Ambulatory Visit: Payer: Self-pay

## 2022-11-01 ENCOUNTER — Emergency Department (HOSPITAL_BASED_OUTPATIENT_CLINIC_OR_DEPARTMENT_OTHER): Payer: 59

## 2022-11-01 ENCOUNTER — Encounter (HOSPITAL_BASED_OUTPATIENT_CLINIC_OR_DEPARTMENT_OTHER): Payer: Self-pay | Admitting: Emergency Medicine

## 2022-11-01 DIAGNOSIS — R1110 Vomiting, unspecified: Secondary | ICD-10-CM | POA: Diagnosis not present

## 2022-11-01 DIAGNOSIS — F1290 Cannabis use, unspecified, uncomplicated: Secondary | ICD-10-CM | POA: Insufficient documentation

## 2022-11-01 DIAGNOSIS — K2970 Gastritis, unspecified, without bleeding: Secondary | ICD-10-CM | POA: Insufficient documentation

## 2022-11-01 DIAGNOSIS — R112 Nausea with vomiting, unspecified: Secondary | ICD-10-CM | POA: Diagnosis not present

## 2022-11-01 DIAGNOSIS — R1013 Epigastric pain: Secondary | ICD-10-CM | POA: Diagnosis not present

## 2022-11-01 LAB — URINALYSIS, ROUTINE W REFLEX MICROSCOPIC
Bilirubin Urine: NEGATIVE
Glucose, UA: 250 mg/dL — AB
Hgb urine dipstick: NEGATIVE
Ketones, ur: 40 mg/dL — AB
Leukocytes,Ua: NEGATIVE
Nitrite: NEGATIVE
Protein, ur: 100 mg/dL — AB
Specific Gravity, Urine: 1.022 (ref 1.005–1.030)
pH: 6 (ref 5.0–8.0)

## 2022-11-01 LAB — COMPREHENSIVE METABOLIC PANEL
ALT: 23 U/L (ref 0–44)
AST: 22 U/L (ref 15–41)
Albumin: 4.5 g/dL (ref 3.5–5.0)
Alkaline Phosphatase: 65 U/L (ref 38–126)
Anion gap: 11 (ref 5–15)
BUN: 8 mg/dL (ref 6–20)
CO2: 28 mmol/L (ref 22–32)
Calcium: 10.2 mg/dL (ref 8.9–10.3)
Chloride: 96 mmol/L — ABNORMAL LOW (ref 98–111)
Creatinine, Ser: 0.92 mg/dL (ref 0.44–1.00)
GFR, Estimated: >60 mL/min (ref >60)
Glucose, Bld: 104 mg/dL — ABNORMAL HIGH (ref 70–99)
Potassium: 4.2 mmol/L (ref 3.5–5.1)
Sodium: 135 mmol/L (ref 135–145)
Total Bilirubin: 0.9 mg/dL (ref 0.3–1.2)
Total Protein: 7.4 g/dL (ref 6.5–8.1)

## 2022-11-01 LAB — CBC WITH DIFFERENTIAL/PLATELET
Abs Immature Granulocytes: 0.01 10*3/uL (ref 0.00–0.07)
Basophils Absolute: 0.1 10*3/uL (ref 0.0–0.1)
Basophils Relative: 1 %
Eosinophils Absolute: 0 10*3/uL (ref 0.0–0.5)
Eosinophils Relative: 0 %
HCT: 39.9 % (ref 36.0–46.0)
Hemoglobin: 13.5 g/dL (ref 12.0–15.0)
Immature Granulocytes: 0 %
Lymphocytes Relative: 38 %
Lymphs Abs: 1.7 10*3/uL (ref 0.7–4.0)
MCH: 28.6 pg (ref 26.0–34.0)
MCHC: 33.8 g/dL (ref 30.0–36.0)
MCV: 84.5 fL (ref 80.0–100.0)
Monocytes Absolute: 0.3 10*3/uL (ref 0.1–1.0)
Monocytes Relative: 6 %
Neutro Abs: 2.5 10*3/uL (ref 1.7–7.7)
Neutrophils Relative %: 55 %
Platelets: 340 10*3/uL (ref 150–400)
RBC: 4.72 MIL/uL (ref 3.87–5.11)
RDW: 13.5 % (ref 11.5–15.5)
WBC: 4.6 10*3/uL (ref 4.0–10.5)
nRBC: 0 % (ref 0.0–0.2)

## 2022-11-01 LAB — PREGNANCY, URINE: Preg Test, Ur: NEGATIVE

## 2022-11-01 LAB — RAPID URINE DRUG SCREEN, HOSP PERFORMED
Amphetamines: NOT DETECTED
Barbiturates: NOT DETECTED
Benzodiazepines: NOT DETECTED
Cocaine: NOT DETECTED
Opiates: NOT DETECTED
Tetrahydrocannabinol: POSITIVE — AB

## 2022-11-01 MED ORDER — PANTOPRAZOLE 80MG IVPB - SIMPLE MED
80.0000 mg | Freq: Once | INTRAVENOUS | Status: AC
  Administered 2022-11-01: 80 mg via INTRAVENOUS
  Filled 2022-11-01: qty 100

## 2022-11-01 MED ORDER — PANTOPRAZOLE SODIUM 40 MG IV SOLR
INTRAVENOUS | Status: AC
  Filled 2022-11-01: qty 20

## 2022-11-01 MED ORDER — TRIMETHOBENZAMIDE HCL 300 MG PO CAPS: 300.0000 mg | ORAL_CAPSULE | Freq: Three times a day (TID) | ORAL | 0 refills | Status: AC | PRN

## 2022-11-01 MED ORDER — DIPHENHYDRAMINE HCL 50 MG/ML IJ SOLN
12.5000 mg | Freq: Once | INTRAMUSCULAR | Status: AC
  Administered 2022-11-01: 12.5 mg via INTRAVENOUS
  Filled 2022-11-01: qty 1

## 2022-11-01 MED ORDER — PANTOPRAZOLE SODIUM 20 MG PO TBEC: 20.0000 mg | DELAYED_RELEASE_TABLET | Freq: Every day | ORAL | 1 refills | Status: AC

## 2022-11-01 MED ORDER — HALOPERIDOL LACTATE 5 MG/ML IJ SOLN
2.5000 mg | Freq: Once | INTRAMUSCULAR | Status: AC
  Administered 2022-11-01: 2.5 mg via INTRAVENOUS
  Filled 2022-11-01: qty 1

## 2022-11-01 MED ORDER — SODIUM CHLORIDE 0.9 % IV BOLUS
1000.0000 mL | Freq: Once | INTRAVENOUS | Status: AC
  Administered 2022-11-01: 1000 mL via INTRAVENOUS

## 2022-11-01 MED ORDER — LACTATED RINGERS IV BOLUS
1000.0000 mL | Freq: Once | INTRAVENOUS | Status: AC
  Administered 2022-11-01: 1000 mL via INTRAVENOUS

## 2022-11-01 MED ORDER — HYOSCYAMINE SULFATE SL 0.125 MG SL SUBL: 0.1250 mg | SUBLINGUAL_TABLET | SUBLINGUAL | 1 refills | Status: AC | PRN

## 2022-11-01 MED ORDER — LIDOCAINE VISCOUS HCL 2 % MT SOLN: 5.0000 mL | Freq: Four times a day (QID) | OROMUCOSAL | 1 refills | Status: AC | PRN

## 2022-11-01 MED ORDER — LIDOCAINE VISCOUS HCL 2 % MT SOLN
15.0000 mL | Freq: Once | OROMUCOSAL | Status: AC
  Administered 2022-11-01: 15 mL via OROMUCOSAL
  Filled 2022-11-01: qty 15

## 2022-11-01 MED ORDER — HYOSCYAMINE SULFATE 0.125 MG SL SUBL
0.1250 mg | SUBLINGUAL_TABLET | Freq: Once | SUBLINGUAL | Status: AC
  Administered 2022-11-01: 0.125 mg via SUBLINGUAL
  Filled 2022-11-01: qty 1

## 2022-11-01 NOTE — ED Notes (Signed)
Patient ambulatory to restroom at this time for urine specimen collection. Patient instructed on how to appropriately obtain urine. Patient stated understanding.

## 2022-11-01 NOTE — ED Triage Notes (Signed)
Pt states she is gagging can't keep anything down. Pt also feels like she needs to have BM, feels tight in her stomach, but can't have BM. She can't remember last BM. She took suppository today with small results. Pt has been taking reglan as prescribed from last visit and not helping.

## 2022-11-01 NOTE — ED Notes (Signed)
Patient tolerated fluids well. Denies symptoms of nausea/ vomiting.

## 2022-11-01 NOTE — Discharge Instructions (Addendum)
Contact a health care provider if: ?Your symptoms get worse. ?Your abdominal pain gets worse. ?Your symptoms return after treatment. ?You have a fever. ?Get help right away if: ?You vomit blood or a substance that looks like coffee grounds. ?You have black or dark red stools. ?You are unable to keep fluids down. ?

## 2022-11-01 NOTE — ED Provider Notes (Signed)
[place] EMERGENCY DEPARTMENT AT [facility] Provider Note   CSN: [id number] Arrival date & time: 11/01/22  1214     History {Add pertinent medical, surgical, social history, OB history to HPI:1} No chief complaint on file.   Kendra Cline is a 22 y.o. female.  Who presents emergency department chief complaint of vomiting.  This is the patient's third ER visit, and fourth medical visit since she began vomiting on 29 March.  This began after going out for a night with friends and drinking alcohol.  Since that time she has had daily vomiting.  She has been seen multiple times for this and has not yet been able to follow-up with GI she complains of constant nausea vomiting multiple times a day being unable to hold down fluids or fluids she has been using Reglan, Phenergan suppositories, Zofran without relief of symptoms she has burning behind her chest wall and epigastric abdominal pain.  She has not been able to make a bowel movement.  She denies urinary symptoms or fever.  She denies using daily marijuana.  Pregnancy test reviewed previously was negative.  She had a CT scan of the abdomen pelvis several days ago that was negative.  I reviewed these images.  Last menstrual period was on 10/27/2022.  Mother is at bedside and is concerned she needs a toxicology screen.  HPI     Home Medications Prior to Admission medications   Medication Sig Start Date End Date Taking? Authorizing Provider  adapalene (DIFFERIN) 0.1 % cream Apply to affected areas daily 02/14/22     adapalene (DIFFERIN) 0.1 % cream Apply topically. 02/11/22     Clindamycin-Benzoyl Per, Refr, gel Apply 1 application  topically at bedtime. 02/24/22     famotidine (PEPCID) 20 MG tablet Take 1 tablet (20 mg total) by mouth 2 (two) times daily. 10/19/22   Renne Crigler, PA-C  FLUoxetine (PROZAC) 10 MG capsule TAKE 1 CAPSULE BY MOUTH ONCE DAILY. 06/12/20 06/12/21  Thedore Mins, MD  FLUoxetine (PROZAC) 10 MG capsule Take 1  capsule by mouth once daily. 09/03/21     FLUoxetine (PROZAC) 10 MG capsule Take 1 capsule (10 mg total) by mouth daily. Patient not taking: Reported on 01/06/2022 10/28/21     FLUoxetine (PROZAC) 20 MG capsule TAKE 1 CAPSULE BY MOUTH DAILY 08/08/20 08/08/21  Thedore Mins, MD  FLUoxetine (PROZAC) 20 MG capsule TAKE 1 CAPSULE BY MOUTH DAILY MAY FILL ON 10/07/20 10/01/20 10/01/21  Thedore Mins, MD  FLUoxetine (PROZAC) 20 MG capsule Take 1 capsule by mouth daily 12/26/20     ibuprofen (ADVIL,MOTRIN) 100 MG/5ML suspension Take 20 mLs (400 mg total) by mouth every 6 (six) hours as needed for mild pain. 10/21/14   Lowanda Foster, NP  metoCLOPramide (REGLAN) 10 MG tablet Take 1 tablet (10 mg total) by mouth every 6 (six) hours. 10/22/22   Benjiman Core, MD  norgestimate-ethinyl estradiol (SPRINTEC 28) 0.25-35 MG-MCG tablet Take 1 tablet by mouth daily. 03/06/21     ondansetron (ZOFRAN-ODT) 4 MG disintegrating tablet Take 1 tablet (4 mg total) by mouth every 8 (eight) hours as needed for nausea or vomiting. 10/19/22   Renne Crigler, PA-C  oseltamivir (TAMIFLU) 75 MG capsule Take 1 capsule (75 mg total) by mouth 2 (two) times daily for 5 days Patient not taking: Reported on 01/06/2022 05/23/21     pimecrolimus (ELIDEL) 1 % cream Apply to affected areas 2 times daily 05/02/20     pimecrolimus (ELIDEL) 1 % cream Apply 1 Application  topically 2 (two) times daily to affected area of face 02/27/22     promethazine (PHENERGAN) 25 MG suppository Place 1 suppository (25 mg total) rectally every 6 (six) hours as needed. 10/21/22     propranolol (INDERAL) 20 MG tablet TAKE 1/2 TO 1 TABLET DAILY BY MOUTH FOR SOCIAL ANXIETY TO BE FILLED 10/07/20 10/01/20 10/01/21  Thedore Mins, MD  propranolol (INDERAL) 20 MG tablet TAKE 1/2 TO 1 TABLET BY MOUTH DAILY FOR SOCIAL ANXIETY 08/08/20 08/08/21  Thedore Mins, MD  propranolol (INDERAL) 20 MG tablet TAKE 1/2 TO 1 TABLET BY MOUTH DAILY FOR SOCIAL ANXIETY. 06/12/20 06/12/21  Thedore Mins, MD  propranolol (INDERAL) 20 MG tablet Take 0.5-1 tablet (10-20 mg total) by mouth daily for social anxiety 12/26/20     sucralfate (CARAFATE) 1 g tablet Take 1 tablet (1 g total) by mouth 4 (four) times daily -  with meals and at bedtime. 10/19/22   Renne Crigler, PA-C      Allergies    Patient has no known allergies.    Review of Systems   Review of Systems  Physical Exam Updated Vital Signs BP (!) 124/91   Pulse 98   Temp 98.4 F (36.9 C) (Oral)   Resp 20   LMP 10/10/2022 Comment: Negative pregnancy test on 10/22/22  SpO2 100%  Physical Exam  ED Results / Procedures / Treatments   Labs (all labs ordered are listed, but only abnormal results are displayed) Labs Reviewed  COMPREHENSIVE METABOLIC PANEL  CBC WITH DIFFERENTIAL/PLATELET  URINALYSIS, ROUTINE W REFLEX MICROSCOPIC  RAPID URINE DRUG SCREEN, HOSP PERFORMED  RAPID URINE DRUG SCREEN, HOSP PERFORMED    EKG None  Radiology No results found.  Procedures Procedures  {Document cardiac monitor, telemetry assessment procedure when appropriate:1}  Medications Ordered in ED Medications  sodium chloride 0.9 % bolus 1,000 mL (has no administration in time range)  pantoprazole (PROTONIX) 80 mg /NS 100 mL IVPB (has no administration in time range)  hyoscyamine (LEVSIN SL) SL tablet 0.125 mg (has no administration in time range)  haloperidol lactate (HALDOL) injection 2.5 mg (has no administration in time range)  diphenhydrAMINE (BENADRYL) injection 12.5 mg (has no administration in time range)    ED Course/ Medical Decision Making/ A&P   {   Click here for ABCD2, HEART and other calculatorsREFRESH Note before signing :1}                          Medical Decision Making Amount and/or Complexity of Data Reviewed Labs: ordered. Radiology: ordered.  Risk Prescription drug management.   ***  {Document critical care time when appropriate:1} {Document review of labs and clinical decision tools ie heart  score, Chads2Vasc2 etc:1}  {Document your independent review of radiology images, and any outside records:1} {Document your discussion with family members, caretakers, and with consultants:1} {Document social determinants of health affecting pt's care:1} {Document your decision making why or why not admission, treatments were needed:1} Final Clinical Impression(s) / ED Diagnoses Final diagnoses:  None    Rx / DC Orders ED Discharge Orders     None

## 2022-11-03 ENCOUNTER — Ambulatory Visit: Payer: 59 | Admitting: Neurology

## 2022-11-03 DIAGNOSIS — R112 Nausea with vomiting, unspecified: Secondary | ICD-10-CM | POA: Diagnosis not present

## 2022-11-03 DIAGNOSIS — K649 Unspecified hemorrhoids: Secondary | ICD-10-CM | POA: Diagnosis not present

## 2022-11-03 DIAGNOSIS — R569 Unspecified convulsions: Secondary | ICD-10-CM

## 2022-11-03 DIAGNOSIS — Q383 Other congenital malformations of tongue: Secondary | ICD-10-CM

## 2022-11-03 DIAGNOSIS — R32 Unspecified urinary incontinence: Secondary | ICD-10-CM

## 2022-11-03 LAB — HEMOGLOBIN A1C
Hgb A1c MFr Bld: 5.3 % (ref 4.8–5.6)
Mean Plasma Glucose: 105 mg/dL

## 2022-11-06 ENCOUNTER — Ambulatory Visit (INDEPENDENT_AMBULATORY_CARE_PROVIDER_SITE_OTHER): Payer: 59 | Admitting: Diagnostic Neuroimaging

## 2022-11-06 ENCOUNTER — Encounter: Payer: Self-pay | Admitting: Diagnostic Neuroimaging

## 2022-11-06 VITALS — BP 102/74 | HR 83 | Ht 67.0 in | Wt 136.0 lb

## 2022-11-06 DIAGNOSIS — R4689 Other symptoms and signs involving appearance and behavior: Secondary | ICD-10-CM

## 2022-11-06 NOTE — Progress Notes (Signed)
[facility]  PATIENT: Kendra Cline DOB: April 23, 2001  REFERRING CLINICIAN: Shon Hale, * HISTORY FROM: patient and mother REASON FOR VISIT: new consult   HISTORICAL  CHIEF COMPLAINT:  Chief Complaint  Patient presents with   New Patient (Initial Visit)    Rm 7, pt with mom, over a period of 2 wks she went out to have margarita's she only had two. She started vomiting non stop. Went to ER tried diff medications dc home and nothing was helping vomiting continued and was unable to eat. Around 4/2 she woke up and had bit her tongue and wet bed and when she went to the ER there was concern that she could have had a sz. Pt denies history of sz. MRI     HISTORY OF PRESENT ILLNESS:   22 year old female here for evaluation of seizure.  October 17, 2022 patient went to dinner with a friend, had 2 margaritas and came home.  Mother noted significant smell of alcohol on breath before she went to bed.  The next morning she woke up with severe nausea and vomiting.  Patient went to the ER for evaluation and was diagnosed as possible viral gastroenteritis.  She went to the ER several times over the next week.  On the morning of 10/21/2022 she woke up with tongue bite marks on the right side of her tongue as well as urinary incontinence.  She felt somewhat confused.  Continued to have nausea vomiting for 2 weeks total.  Last ER visit was on 11/01/2022.  Eventually symptoms resolved.  Now patient back to baseline.  Patient normally does not drink alcohol on a regular basis.  She may have something to drink only a few times a year.  She was using cannabis before but quit around January February 2024.  Family history of seizure notable in 2 maternal uncles and 1 maternal cousin.  Maternal cousin may have had febrile seizures.  No similar prior events.  No witnessed convulsions.   REVIEW OF SYSTEMS: Full 14 system review of systems performed and negative with exception of: as  per HPI.  ALLERGIES: No Known Allergies  HOME MEDICATIONS: Outpatient Medications Prior to Visit  Medication Sig Dispense Refill   Clindamycin-Benzoyl Per, Refr, gel Apply 1 application  topically at bedtime. (Patient taking differently: Apply 1 application  topically at bedtime as needed.) 45 g 2   Hyoscyamine Sulfate SL (LEVSIN/SL) 0.125 MG SUBL Place 1 tablet (0.125 mg total) under the tongue every 4 (four) hours as needed (pain). Up to 1.25 mg daily 30 tablet 1   ibuprofen (ADVIL,MOTRIN) 100 MG/5ML suspension Take 20 mLs (400 mg total) by mouth every 6 (six) hours as needed for mild pain. 237 mL 0   lidocaine (XYLOCAINE) 2 % solution Use as directed 5-10 mLs in the mouth or throat every 6 (six) hours as needed for mouth pain. 200 mL 1   metoCLOPramide (REGLAN) 10 MG tablet Take 1 tablet (10 mg total) by mouth every 6 (six) hours. 10 tablet 0   ondansetron (ZOFRAN-ODT) 4 MG disintegrating tablet Take 1 tablet (4 mg total) by mouth every 8 (eight) hours as needed for nausea or vomiting. 10 tablet 0   pantoprazole (PROTONIX) 20 MG tablet Take 1 tablet (20 mg total) by mouth daily. 30 tablet 1   pimecrolimus (ELIDEL) 1 % cream Apply 1 Application topically 2 (two) times daily to affected area of face 60 g 2   sucralfate (CARAFATE) 1 g tablet Take 1  tablet (1 g total) by mouth 4 (four) times daily -  with meals and at bedtime. 60 tablet 0   trimethobenzamide (TIGAN) 300 MG capsule Take 1 capsule (300 mg total) by mouth 3 (three) times daily as needed for nausea/vomiting. 30 capsule 0   famotidine (PEPCID) 20 MG tablet Take 1 tablet (20 mg total) by mouth 2 (two) times daily. 10 tablet 0   norgestimate-ethinyl estradiol (SPRINTEC 28) 0.25-35 MG-MCG tablet Take 1 tablet by mouth daily. 28 tablet 12   adapalene (DIFFERIN) 0.1 % cream Apply to affected areas daily 45 g 2   oseltamivir (TAMIFLU) 75 MG capsule Take 1 capsule (75 mg total) by mouth 2 (two) times daily for 5 days (Patient not taking:  Reported on 01/06/2022) 10 capsule 0   promethazine (PHENERGAN) 25 MG suppository Place 1 suppository (25 mg total) rectally every 6 (six) hours as needed. 30 suppository 0   propranolol (INDERAL) 20 MG tablet Take 0.5-1 tablet (10-20 mg total) by mouth daily for social anxiety 30 tablet 2   No facility-administered medications prior to visit.    PAST MEDICAL HISTORY: No past medical history on file.  PAST SURGICAL HISTORY: Past Surgical History:  Procedure Laterality Date   HERNIA REPAIR      FAMILY HISTORY: Family History  Problem Relation Age of Onset   Healthy Mother    Healthy Father     SOCIAL HISTORY: Social History   Socioeconomic History   Marital status: Single    Spouse name: Not on file   Number of children: Not on file   Years of education: Not on file   Highest education level: Not on file  Occupational History   Not on file  Tobacco Use   Smoking status: Never   Smokeless tobacco: Never  Vaping Use   Vaping Use: Never used  Substance and Sexual Activity   Alcohol use: Yes    Comment: Occ   Drug use: Never   Sexual activity: Not Currently  Other Topics Concern   Not on file  Social History Narrative   Not on file   Social Determinants of Health   Financial Resource Strain: Not on file  Food Insecurity: Not on file  Transportation Needs: Not on file  Physical Activity: Not on file  Stress: Not on file  Social Connections: Not on file  Intimate Partner Violence: Not on file     PHYSICAL EXAM  GENERAL EXAM/CONSTITUTIONAL: Vitals:  Vitals:   11/06/22 1311  BP: 102/74  Pulse: 83  Weight: 136 lb (61.7 kg)  Height: 5\' 7"  (1.702 m)   Body mass index is 21.3 kg/m. Wt Readings from Last 3 Encounters:  11/06/22 136 lb (61.7 kg)  10/22/22 149 lb 14.6 oz (68 kg)  10/20/22 150 lb (68 kg)   Patient is in no distress; well developed, nourished and groomed; neck is supple  CARDIOVASCULAR: Examination of carotid arteries is normal; no  carotid bruits Regular rate and rhythm, no murmurs Examination of peripheral vascular system by observation and palpation is normal  EYES: Ophthalmoscopic exam of optic discs and posterior segments is normal; no papilledema or hemorrhages No results found.  MUSCULOSKELETAL: Gait, strength, tone, movements noted in Neurologic exam below  NEUROLOGIC: MENTAL STATUS:      No data to display         awake, alert, oriented to person, place and time recent and remote memory intact normal attention and concentration language fluent, comprehension intact, naming intact fund of knowledge appropriate  CRANIAL NERVE:  2nd - no papilledema on fundoscopic exam 2nd, 3rd, 4th, 6th - pupils equal and reactive to light, visual fields full to confrontation, extraocular muscles intact, no nystagmus 5th - facial sensation symmetric 7th - facial strength symmetric 8th - hearing intact 9th - palate elevates symmetrically, uvula midline 11th - shoulder shrug symmetric 12th - tongue protrusion midline  MOTOR:  normal bulk and tone, full strength in the BUE, BLE  SENSORY:  normal and symmetric to light touch, temperature, vibration  COORDINATION:  finger-nose-finger, fine finger movements normal  REFLEXES:  deep tendon reflexes present and symmetric  GAIT/STATION:  narrow based gait     DIAGNOSTIC DATA (LABS, IMAGING, TESTING) - I reviewed patient records, labs, notes, testing and imaging myself where available.  Lab Results  Component Value Date   WBC 4.6 11/01/2022   HGB 13.5 11/01/2022   HCT 39.9 11/01/2022   MCV 84.5 11/01/2022   PLT 340 11/01/2022      Component Value Date/Time   NA 135 11/01/2022 1346   K 4.2 11/01/2022 1346   CL 96 (L) 11/01/2022 1346   CO2 28 11/01/2022 1346   GLUCOSE 104 (H) 11/01/2022 1346   BUN 8 11/01/2022 1346   CREATININE 0.92 11/01/2022 1346   CREATININE 0.84 01/06/2022 1340   CALCIUM 10.2 11/01/2022 1346   PROT 7.4 11/01/2022 1346    ALBUMIN 4.5 11/01/2022 1346   AST 22 11/01/2022 1346   AST 17 01/06/2022 1340   ALT 23 11/01/2022 1346   ALT 13 01/06/2022 1340   ALKPHOS 65 11/01/2022 1346   BILITOT 0.9 11/01/2022 1346   BILITOT 0.3 01/06/2022 1340   GFRNONAA >60 11/01/2022 1346   GFRNONAA >60 01/06/2022 1340   No results found for: "CHOL", "HDL", "LDLCALC", "LDLDIRECT", "TRIG", "CHOLHDL" Lab Results  Component Value Date   HGBA1C 5.3 11/01/2022   Lab Results  Component Value Date   VITAMINB12 487 01/06/2022   No results found for: "TSH"   10/22/22 CT abd / pelvis - There is no evidence of intestinal obstruction or pneumoperitoneum. There is no hydronephrosis. Appendix is not dilated. - Small amount of free fluid in pelvis may suggest recent rupture of ovarian cyst or follicle.  10/27/22 MRI brain  - Normal brain MRI. No acute intracranial abnormality or findings to explain patient's symptoms.    ASSESSMENT AND PLAN  22 y.o. year old female here with:   Dx:  1. Spell of abnormal behavior      PLAN:  1 episode of --> TONGUE BITING, INCONTINENCE, CONFUSION (unwitnessed nocturnal event 10/21/22; possible nocturnal seizure but not clear cut; in setting of possible underlying viral gastroenteritis)   - check EEG  - avoid alcohol and cannabis  - According to [place] law, you can not drive unless you are seizure / syncope free for at least 6 months and under physician's care.   - Please maintain precautions. Do not participate in activities where a loss of awareness could harm you or someone else. No swimming alone, no tub bathing, no hot tubs, no driving, no operating motorized vehicles (cars, ATVs, motocycles, etc), lawnmowers, power tools or firearms. No standing at heights, such as rooftops, ladders or stairs. Avoid hot objects such as stoves, heaters, open fires. Wear a helmet when riding a bicycle, scooter, skateboard, etc. and avoid areas of traffic. Set your water heater to 120 degrees or less.   Orders  Placed This Encounter  Procedures   EEG adult   Return for pending test results,  pending if symptoms worsen or fail to improve.    Suanne Marker, MD 11/06/2022, 1:50 PM Certified in Neurology, Neurophysiology and Neuroimaging  [facility] [address] [place], [place] [address] [phone]

## 2022-11-06 NOTE — Patient Instructions (Signed)
  TONGUE BITING, INCONTINENCE, CONFUSION (unwitnessed nocturnal event 10/21/22; possible nocturnal seizure but not clear cut; in setting of possible underlying viral gastroenteritis)   - check EEG  - avoid alcohol and cannabis  - According to [place] law, you can not drive unless you are seizure / syncope free for at least 6 months and under physician's care.   - Please maintain precautions. Do not participate in activities where a loss of awareness could harm you or someone else. No swimming alone, no tub bathing, no hot tubs, no driving, no operating motorized vehicles (cars, ATVs, motocycles, etc), lawnmowers, power tools or firearms. No standing at heights, such as rooftops, ladders or stairs. Avoid hot objects such as stoves, heaters, open fires. Wear a helmet when riding a bicycle, scooter, skateboard, etc. and avoid areas of traffic. Set your water heater to 120 degrees or less.

## 2022-11-19 DIAGNOSIS — Z419 Encounter for procedure for purposes other than remedying health state, unspecified: Secondary | ICD-10-CM | POA: Diagnosis not present

## 2022-11-20 ENCOUNTER — Ambulatory Visit (INDEPENDENT_AMBULATORY_CARE_PROVIDER_SITE_OTHER): Payer: 59 | Admitting: Diagnostic Neuroimaging

## 2022-11-20 DIAGNOSIS — R569 Unspecified convulsions: Secondary | ICD-10-CM

## 2022-11-20 DIAGNOSIS — R4689 Other symptoms and signs involving appearance and behavior: Secondary | ICD-10-CM

## 2022-12-08 NOTE — Procedures (Signed)
   [facility]  EEG (ELECTROENCEPHALOGRAM) REPORT   STUDY DATE: 11/20/22 PATIENT NAME: Kendra Cline DOB: Mar 03, 2001 MRN: [id number]  ORDERING CLINICIAN: Joycelyn Schmid, MD   TECHNOLOGIST: Marcheta Grammes TECHNIQUE: Electroencephalogram was recorded utilizing standard 10-20 system of lead placement and reformatted into average and bipolar montages.  RECORDING TIME: 24 minutes ACTIVATION: hyperventilation and photic stimulation  CLINICAL INFORMATION: 22 year old female with seizure.  FINDINGS: Posterior dominant background rhythms, which attenuate with eye opening, ranging 11-12 hertz and 10-20 microvolts. No focal, lateralizing, epileptiform activity or seizures are seen. Patient recorded in the awake and drowsy state. EKG channel shows regular rhythm of 60-65 beats per minute.   IMPRESSION:   Normal EEG in the awake and drowsy states.   INTERPRETING PHYSICIAN:  Suanne Marker, MD Certified in Neurology, Neurophysiology and Neuroimaging  [facility] [address] [place], [place] [address] [phone]

## 2022-12-20 DIAGNOSIS — Z419 Encounter for procedure for purposes other than remedying health state, unspecified: Secondary | ICD-10-CM | POA: Diagnosis not present

## 2022-12-30 DIAGNOSIS — Z113 Encounter for screening for infections with a predominantly sexual mode of transmission: Secondary | ICD-10-CM | POA: Diagnosis not present

## 2022-12-30 DIAGNOSIS — Z114 Encounter for screening for human immunodeficiency virus [HIV]: Secondary | ICD-10-CM | POA: Diagnosis not present

## 2023-01-19 DIAGNOSIS — Z419 Encounter for procedure for purposes other than remedying health state, unspecified: Secondary | ICD-10-CM | POA: Diagnosis not present

## 2023-01-30 ENCOUNTER — Other Ambulatory Visit (HOSPITAL_COMMUNITY): Payer: Self-pay

## 2023-01-30 DIAGNOSIS — N921 Excessive and frequent menstruation with irregular cycle: Secondary | ICD-10-CM | POA: Diagnosis not present

## 2023-01-30 DIAGNOSIS — R109 Unspecified abdominal pain: Secondary | ICD-10-CM | POA: Diagnosis not present

## 2023-02-11 ENCOUNTER — Other Ambulatory Visit (HOSPITAL_COMMUNITY): Payer: Self-pay

## 2023-02-19 DIAGNOSIS — Z419 Encounter for procedure for purposes other than remedying health state, unspecified: Secondary | ICD-10-CM | POA: Diagnosis not present

## 2023-03-22 DIAGNOSIS — Z419 Encounter for procedure for purposes other than remedying health state, unspecified: Secondary | ICD-10-CM | POA: Diagnosis not present

## 2023-04-21 DIAGNOSIS — Z419 Encounter for procedure for purposes other than remedying health state, unspecified: Secondary | ICD-10-CM | POA: Diagnosis not present

## 2023-05-07 ENCOUNTER — Other Ambulatory Visit (HOSPITAL_COMMUNITY): Payer: Self-pay

## 2023-05-07 MED ORDER — HYDROCODONE-ACETAMINOPHEN 5-325 MG PO TABS
1.0000 | ORAL_TABLET | ORAL | 0 refills | Status: AC | PRN
  Filled 2023-05-07: qty 6, 1d supply, fill #0

## 2023-05-18 DIAGNOSIS — N898 Other specified noninflammatory disorders of vagina: Secondary | ICD-10-CM | POA: Diagnosis not present

## 2023-05-18 DIAGNOSIS — N764 Abscess of vulva: Secondary | ICD-10-CM | POA: Diagnosis not present

## 2023-05-18 DIAGNOSIS — Z113 Encounter for screening for infections with a predominantly sexual mode of transmission: Secondary | ICD-10-CM | POA: Diagnosis not present

## 2023-05-18 DIAGNOSIS — D500 Iron deficiency anemia secondary to blood loss (chronic): Secondary | ICD-10-CM | POA: Diagnosis not present

## 2023-05-18 DIAGNOSIS — Z862 Personal history of diseases of the blood and blood-forming organs and certain disorders involving the immune mechanism: Secondary | ICD-10-CM | POA: Diagnosis not present

## 2023-05-18 DIAGNOSIS — Z3041 Encounter for surveillance of contraceptive pills: Secondary | ICD-10-CM | POA: Diagnosis not present

## 2023-05-18 DIAGNOSIS — Z01419 Encounter for gynecological examination (general) (routine) without abnormal findings: Secondary | ICD-10-CM | POA: Diagnosis not present

## 2023-05-20 ENCOUNTER — Other Ambulatory Visit (HOSPITAL_COMMUNITY): Payer: Self-pay

## 2023-05-20 MED ORDER — FERROUS SULFATE 325 (65 FE) MG PO TABS
325.0000 mg | ORAL_TABLET | Freq: Two times a day (BID) | ORAL | 0 refills | Status: AC
  Filled 2023-05-20: qty 180, 90d supply, fill #0

## 2023-05-22 DIAGNOSIS — Z419 Encounter for procedure for purposes other than remedying health state, unspecified: Secondary | ICD-10-CM | POA: Diagnosis not present

## 2023-06-11 ENCOUNTER — Other Ambulatory Visit (HOSPITAL_COMMUNITY): Payer: Self-pay

## 2023-06-11 MED ORDER — ATOVAQUONE-PROGUANIL HCL 250-100 MG PO TABS
1.0000 | ORAL_TABLET | Freq: Every day | ORAL | 0 refills | Status: AC
  Filled 2023-06-11: qty 36, 30d supply, fill #0

## 2023-06-11 MED ORDER — AZITHROMYCIN 500 MG PO TABS
ORAL_TABLET | ORAL | 0 refills | Status: AC
  Filled 2023-06-11: qty 4, 3d supply, fill #0

## 2023-06-21 DIAGNOSIS — Z419 Encounter for procedure for purposes other than remedying health state, unspecified: Secondary | ICD-10-CM | POA: Diagnosis not present

## 2023-07-22 DIAGNOSIS — Z419 Encounter for procedure for purposes other than remedying health state, unspecified: Secondary | ICD-10-CM | POA: Diagnosis not present

## 2023-08-22 DIAGNOSIS — Z419 Encounter for procedure for purposes other than remedying health state, unspecified: Secondary | ICD-10-CM | POA: Diagnosis not present

## 2023-09-09 DIAGNOSIS — D500 Iron deficiency anemia secondary to blood loss (chronic): Secondary | ICD-10-CM | POA: Diagnosis not present

## 2023-09-09 DIAGNOSIS — R791 Abnormal coagulation profile: Secondary | ICD-10-CM | POA: Diagnosis not present

## 2023-09-19 DIAGNOSIS — Z419 Encounter for procedure for purposes other than remedying health state, unspecified: Secondary | ICD-10-CM | POA: Diagnosis not present

## 2023-10-14 ENCOUNTER — Other Ambulatory Visit (HOSPITAL_COMMUNITY): Payer: Self-pay

## 2023-10-14 DIAGNOSIS — K648 Other hemorrhoids: Secondary | ICD-10-CM | POA: Diagnosis not present

## 2023-10-14 MED ORDER — HYDROCORTISONE (PERIANAL) 2.5 % EX CREA
1.0000 | TOPICAL_CREAM | Freq: Two times a day (BID) | CUTANEOUS | 0 refills | Status: AC
  Filled 2023-10-14 – 2023-11-20 (×2): qty 30, 7d supply, fill #0

## 2023-10-26 ENCOUNTER — Other Ambulatory Visit (HOSPITAL_COMMUNITY): Payer: Self-pay

## 2023-10-31 DIAGNOSIS — Z419 Encounter for procedure for purposes other than remedying health state, unspecified: Secondary | ICD-10-CM | POA: Diagnosis not present

## 2023-11-20 ENCOUNTER — Other Ambulatory Visit (HOSPITAL_COMMUNITY): Payer: Self-pay

## 2023-11-30 DIAGNOSIS — Z419 Encounter for procedure for purposes other than remedying health state, unspecified: Secondary | ICD-10-CM | POA: Diagnosis not present

## 2023-12-31 DIAGNOSIS — Z419 Encounter for procedure for purposes other than remedying health state, unspecified: Secondary | ICD-10-CM | POA: Diagnosis not present

## 2024-01-30 DIAGNOSIS — Z419 Encounter for procedure for purposes other than remedying health state, unspecified: Secondary | ICD-10-CM | POA: Diagnosis not present

## 2024-03-01 DIAGNOSIS — Z419 Encounter for procedure for purposes other than remedying health state, unspecified: Secondary | ICD-10-CM | POA: Diagnosis not present

## 2024-04-01 DIAGNOSIS — Z419 Encounter for procedure for purposes other than remedying health state, unspecified: Secondary | ICD-10-CM | POA: Diagnosis not present
